# Patient Record
Sex: Female | Born: 1980 | Race: Black or African American | Hispanic: No | Marital: Single | Smoking: Current every day smoker
Health system: Southern US, Community
[De-identification: ages and names within clinical notes are randomized; demographics above are authoritative.]

## PROBLEM LIST (undated history)

## (undated) DIAGNOSIS — E669 Obesity, unspecified: Secondary | ICD-10-CM

## (undated) HISTORY — PX: ABDOMINAL HYSTERECTOMY: SHX81

---
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
## 2000-01-05 ENCOUNTER — Emergency Department (HOSPITAL_COMMUNITY): Admission: EM | Admit: 2000-01-05 | Discharge: 2000-01-05 | Payer: Self-pay | Admitting: Emergency Medicine

## 2000-01-05 ENCOUNTER — Encounter: Payer: Self-pay | Admitting: Emergency Medicine

## 2000-01-09 ENCOUNTER — Observation Stay (HOSPITAL_COMMUNITY): Admission: AD | Admit: 2000-01-09 | Discharge: 2000-01-10 | Payer: Self-pay | Admitting: Obstetrics and Gynecology

## 2000-01-31 ENCOUNTER — Inpatient Hospital Stay (HOSPITAL_COMMUNITY): Admission: AD | Admit: 2000-01-31 | Discharge: 2000-01-31 | Payer: Self-pay | Admitting: Obstetrics

## 2000-02-01 ENCOUNTER — Inpatient Hospital Stay: Admission: AD | Admit: 2000-02-01 | Discharge: 2000-02-01 | Payer: Self-pay | Admitting: Obstetrics & Gynecology

## 2000-02-05 ENCOUNTER — Inpatient Hospital Stay (HOSPITAL_COMMUNITY): Admission: AD | Admit: 2000-02-05 | Discharge: 2000-02-07 | Payer: Self-pay | Admitting: Obstetrics

## 2000-02-12 ENCOUNTER — Inpatient Hospital Stay (HOSPITAL_COMMUNITY): Admission: AD | Admit: 2000-02-12 | Discharge: 2000-02-13 | Payer: Self-pay | Admitting: Obstetrics

## 2000-02-26 ENCOUNTER — Inpatient Hospital Stay (HOSPITAL_COMMUNITY): Admission: AD | Admit: 2000-02-26 | Discharge: 2000-03-01 | Payer: Self-pay | Admitting: Obstetrics & Gynecology

## 2000-03-05 ENCOUNTER — Inpatient Hospital Stay (HOSPITAL_COMMUNITY): Admission: AD | Admit: 2000-03-05 | Discharge: 2000-03-05 | Payer: Self-pay | Admitting: *Deleted

## 2000-03-15 ENCOUNTER — Inpatient Hospital Stay (HOSPITAL_COMMUNITY): Admission: AD | Admit: 2000-03-15 | Discharge: 2000-03-15 | Payer: Self-pay | Admitting: *Deleted

## 2000-03-17 ENCOUNTER — Inpatient Hospital Stay (HOSPITAL_COMMUNITY): Admission: AD | Admit: 2000-03-17 | Discharge: 2000-03-17 | Payer: Self-pay | Admitting: Obstetrics & Gynecology

## 2000-03-19 ENCOUNTER — Inpatient Hospital Stay (HOSPITAL_COMMUNITY): Admission: AD | Admit: 2000-03-19 | Discharge: 2000-03-21 | Payer: Self-pay | Admitting: Obstetrics and Gynecology

## 2000-03-20 ENCOUNTER — Encounter: Payer: Self-pay | Admitting: Obstetrics and Gynecology

## 2000-04-04 ENCOUNTER — Other Ambulatory Visit: Admission: RE | Admit: 2000-04-04 | Discharge: 2000-04-04 | Payer: Self-pay | Admitting: *Deleted

## 2000-04-04 ENCOUNTER — Observation Stay (HOSPITAL_COMMUNITY): Admission: AD | Admit: 2000-04-04 | Discharge: 2000-04-04 | Payer: Self-pay | Admitting: *Deleted

## 2000-04-11 ENCOUNTER — Ambulatory Visit (HOSPITAL_COMMUNITY): Admission: RE | Admit: 2000-04-11 | Discharge: 2000-04-11 | Payer: Self-pay | Admitting: *Deleted

## 2000-04-11 ENCOUNTER — Encounter: Payer: Self-pay | Admitting: *Deleted

## 2000-04-28 ENCOUNTER — Observation Stay (HOSPITAL_COMMUNITY): Admission: AD | Admit: 2000-04-28 | Discharge: 2000-04-29 | Payer: Self-pay | Admitting: Obstetrics & Gynecology

## 2000-05-19 ENCOUNTER — Inpatient Hospital Stay (HOSPITAL_COMMUNITY): Admission: AD | Admit: 2000-05-19 | Discharge: 2000-05-19 | Payer: Self-pay | Admitting: Obstetrics and Gynecology

## 2000-06-03 ENCOUNTER — Encounter: Payer: Self-pay | Admitting: Obstetrics and Gynecology

## 2000-06-03 ENCOUNTER — Ambulatory Visit (HOSPITAL_COMMUNITY): Admission: RE | Admit: 2000-06-03 | Discharge: 2000-06-03 | Payer: Self-pay | Admitting: Obstetrics and Gynecology

## 2000-07-16 ENCOUNTER — Inpatient Hospital Stay (HOSPITAL_COMMUNITY): Admission: AD | Admit: 2000-07-16 | Discharge: 2000-07-16 | Payer: Self-pay | Admitting: Obstetrics and Gynecology

## 2000-07-30 ENCOUNTER — Inpatient Hospital Stay (HOSPITAL_COMMUNITY): Admission: AD | Admit: 2000-07-30 | Discharge: 2000-07-30 | Payer: Self-pay | Admitting: Obstetrics and Gynecology

## 2000-08-03 ENCOUNTER — Inpatient Hospital Stay (HOSPITAL_COMMUNITY): Admission: AD | Admit: 2000-08-03 | Discharge: 2000-08-03 | Payer: Self-pay | Admitting: *Deleted

## 2000-08-06 ENCOUNTER — Inpatient Hospital Stay (HOSPITAL_COMMUNITY): Admission: RE | Admit: 2000-08-06 | Discharge: 2000-08-06 | Payer: Self-pay | Admitting: Obstetrics and Gynecology

## 2000-08-06 ENCOUNTER — Encounter: Payer: Self-pay | Admitting: Obstetrics and Gynecology

## 2000-08-09 ENCOUNTER — Observation Stay (HOSPITAL_COMMUNITY): Admission: AD | Admit: 2000-08-09 | Discharge: 2000-08-10 | Payer: Self-pay | Admitting: Obstetrics and Gynecology

## 2000-08-17 ENCOUNTER — Inpatient Hospital Stay (HOSPITAL_COMMUNITY): Admission: AD | Admit: 2000-08-17 | Discharge: 2000-08-17 | Payer: Self-pay | Admitting: Obstetrics and Gynecology

## 2000-08-19 ENCOUNTER — Inpatient Hospital Stay (HOSPITAL_COMMUNITY): Admission: AD | Admit: 2000-08-19 | Discharge: 2000-08-22 | Payer: Self-pay | Admitting: *Deleted

## 2001-01-02 ENCOUNTER — Emergency Department (HOSPITAL_COMMUNITY): Admission: EM | Admit: 2001-01-02 | Discharge: 2001-01-02 | Payer: Self-pay | Admitting: Emergency Medicine

## 2001-01-02 ENCOUNTER — Encounter: Payer: Self-pay | Admitting: Emergency Medicine

## 2001-07-20 ENCOUNTER — Emergency Department (HOSPITAL_COMMUNITY): Admission: EM | Admit: 2001-07-20 | Discharge: 2001-07-21 | Payer: Self-pay | Admitting: Internal Medicine

## 2002-05-09 ENCOUNTER — Emergency Department (HOSPITAL_COMMUNITY): Admission: EM | Admit: 2002-05-09 | Discharge: 2002-05-09 | Payer: Self-pay | Admitting: Emergency Medicine

## 2002-05-14 ENCOUNTER — Inpatient Hospital Stay (HOSPITAL_COMMUNITY): Admission: AD | Admit: 2002-05-14 | Discharge: 2002-05-14 | Payer: Self-pay | Admitting: Obstetrics and Gynecology

## 2002-05-15 ENCOUNTER — Inpatient Hospital Stay (HOSPITAL_COMMUNITY): Admission: AD | Admit: 2002-05-15 | Discharge: 2002-05-15 | Payer: Self-pay | Admitting: Obstetrics and Gynecology

## 2002-05-17 ENCOUNTER — Inpatient Hospital Stay (HOSPITAL_COMMUNITY): Admission: AD | Admit: 2002-05-17 | Discharge: 2002-05-17 | Payer: Self-pay | Admitting: *Deleted

## 2002-05-17 ENCOUNTER — Encounter: Payer: Self-pay | Admitting: Obstetrics and Gynecology

## 2002-05-19 ENCOUNTER — Inpatient Hospital Stay (HOSPITAL_COMMUNITY): Admission: AD | Admit: 2002-05-19 | Discharge: 2002-05-19 | Payer: Self-pay | Admitting: Obstetrics and Gynecology

## 2002-05-21 ENCOUNTER — Observation Stay (HOSPITAL_COMMUNITY): Admission: AD | Admit: 2002-05-21 | Discharge: 2002-05-22 | Payer: Self-pay | Admitting: Obstetrics and Gynecology

## 2002-05-31 ENCOUNTER — Inpatient Hospital Stay (HOSPITAL_COMMUNITY): Admission: AD | Admit: 2002-05-31 | Discharge: 2002-05-31 | Payer: Self-pay | Admitting: Obstetrics and Gynecology

## 2002-06-01 ENCOUNTER — Inpatient Hospital Stay (HOSPITAL_COMMUNITY): Admission: AD | Admit: 2002-06-01 | Discharge: 2002-06-01 | Payer: Self-pay | Admitting: Obstetrics and Gynecology

## 2002-06-04 ENCOUNTER — Inpatient Hospital Stay (HOSPITAL_COMMUNITY): Admission: AD | Admit: 2002-06-04 | Discharge: 2002-06-08 | Payer: Self-pay | Admitting: Obstetrics and Gynecology

## 2002-06-11 ENCOUNTER — Encounter: Admission: RE | Admit: 2002-06-11 | Discharge: 2002-06-11 | Payer: Self-pay | Admitting: *Deleted

## 2002-06-19 ENCOUNTER — Inpatient Hospital Stay (HOSPITAL_COMMUNITY): Admission: AD | Admit: 2002-06-19 | Discharge: 2002-06-19 | Payer: Self-pay | Admitting: *Deleted

## 2002-06-23 ENCOUNTER — Inpatient Hospital Stay (HOSPITAL_COMMUNITY): Admission: AD | Admit: 2002-06-23 | Discharge: 2002-06-26 | Payer: Self-pay | Admitting: *Deleted

## 2002-07-02 ENCOUNTER — Ambulatory Visit (HOSPITAL_COMMUNITY): Admission: RE | Admit: 2002-07-02 | Discharge: 2002-07-02 | Payer: Self-pay | Admitting: *Deleted

## 2002-07-02 ENCOUNTER — Encounter: Admission: RE | Admit: 2002-07-02 | Discharge: 2002-07-02 | Payer: Self-pay | Admitting: *Deleted

## 2002-07-02 ENCOUNTER — Inpatient Hospital Stay (HOSPITAL_COMMUNITY): Admission: AD | Admit: 2002-07-02 | Discharge: 2002-07-04 | Payer: Self-pay | Admitting: *Deleted

## 2002-07-09 ENCOUNTER — Encounter: Admission: RE | Admit: 2002-07-09 | Discharge: 2002-07-09 | Payer: Self-pay | Admitting: *Deleted

## 2002-07-11 ENCOUNTER — Inpatient Hospital Stay (HOSPITAL_COMMUNITY): Admission: AD | Admit: 2002-07-11 | Discharge: 2002-07-17 | Payer: Self-pay | Admitting: *Deleted

## 2002-07-12 ENCOUNTER — Encounter: Payer: Self-pay | Admitting: *Deleted

## 2002-07-21 ENCOUNTER — Inpatient Hospital Stay (HOSPITAL_COMMUNITY): Admission: AD | Admit: 2002-07-21 | Discharge: 2002-07-21 | Payer: Self-pay | Admitting: *Deleted

## 2002-07-23 ENCOUNTER — Encounter: Admission: RE | Admit: 2002-07-23 | Discharge: 2002-07-23 | Payer: Self-pay | Admitting: *Deleted

## 2002-07-25 ENCOUNTER — Inpatient Hospital Stay (HOSPITAL_COMMUNITY): Admission: AD | Admit: 2002-07-25 | Discharge: 2002-07-30 | Payer: Self-pay | Admitting: *Deleted

## 2002-07-28 ENCOUNTER — Encounter: Payer: Self-pay | Admitting: *Deleted

## 2002-08-03 ENCOUNTER — Inpatient Hospital Stay (HOSPITAL_COMMUNITY): Admission: AD | Admit: 2002-08-03 | Discharge: 2002-08-07 | Payer: Self-pay | Admitting: Obstetrics and Gynecology

## 2002-08-13 ENCOUNTER — Observation Stay (HOSPITAL_COMMUNITY): Admission: AD | Admit: 2002-08-13 | Discharge: 2002-08-14 | Payer: Self-pay | Admitting: *Deleted

## 2002-08-13 ENCOUNTER — Encounter: Admission: RE | Admit: 2002-08-13 | Discharge: 2002-08-13 | Payer: Self-pay | Admitting: *Deleted

## 2002-08-20 ENCOUNTER — Inpatient Hospital Stay (HOSPITAL_COMMUNITY): Admission: AD | Admit: 2002-08-20 | Discharge: 2002-08-23 | Payer: Self-pay | Admitting: *Deleted

## 2002-08-27 ENCOUNTER — Encounter: Admission: RE | Admit: 2002-08-27 | Discharge: 2002-08-27 | Payer: Self-pay | Admitting: *Deleted

## 2002-09-03 ENCOUNTER — Encounter: Admission: RE | Admit: 2002-09-03 | Discharge: 2002-09-03 | Payer: Self-pay | Admitting: *Deleted

## 2002-09-14 ENCOUNTER — Ambulatory Visit (HOSPITAL_COMMUNITY): Admission: RE | Admit: 2002-09-14 | Discharge: 2002-09-14 | Payer: Self-pay | Admitting: *Deleted

## 2002-09-17 ENCOUNTER — Encounter: Admission: RE | Admit: 2002-09-17 | Discharge: 2002-09-17 | Payer: Self-pay | Admitting: *Deleted

## 2002-09-24 ENCOUNTER — Inpatient Hospital Stay (HOSPITAL_COMMUNITY): Admission: AD | Admit: 2002-09-24 | Discharge: 2002-09-26 | Payer: Self-pay | Admitting: Obstetrics and Gynecology

## 2002-10-01 ENCOUNTER — Encounter: Admission: RE | Admit: 2002-10-01 | Discharge: 2002-10-01 | Payer: Self-pay | Admitting: *Deleted

## 2002-10-03 ENCOUNTER — Inpatient Hospital Stay (HOSPITAL_COMMUNITY): Admission: AD | Admit: 2002-10-03 | Discharge: 2002-10-03 | Payer: Self-pay | Admitting: *Deleted

## 2002-10-07 ENCOUNTER — Inpatient Hospital Stay (HOSPITAL_COMMUNITY): Admission: AD | Admit: 2002-10-07 | Discharge: 2002-10-07 | Payer: Self-pay | Admitting: Obstetrics and Gynecology

## 2002-10-08 ENCOUNTER — Encounter: Admission: RE | Admit: 2002-10-08 | Discharge: 2002-10-08 | Payer: Self-pay | Admitting: *Deleted

## 2002-10-08 ENCOUNTER — Inpatient Hospital Stay (HOSPITAL_COMMUNITY): Admission: AD | Admit: 2002-10-08 | Discharge: 2002-10-14 | Payer: Self-pay | Admitting: Obstetrics and Gynecology

## 2002-10-10 ENCOUNTER — Encounter: Payer: Self-pay | Admitting: *Deleted

## 2002-10-15 ENCOUNTER — Inpatient Hospital Stay (HOSPITAL_COMMUNITY): Admission: AD | Admit: 2002-10-15 | Discharge: 2002-10-17 | Payer: Self-pay | Admitting: *Deleted

## 2002-10-15 ENCOUNTER — Encounter: Admission: RE | Admit: 2002-10-15 | Discharge: 2002-10-15 | Payer: Self-pay | Admitting: *Deleted

## 2002-10-22 ENCOUNTER — Encounter: Admission: RE | Admit: 2002-10-22 | Discharge: 2002-10-22 | Payer: Self-pay | Admitting: *Deleted

## 2002-10-22 ENCOUNTER — Inpatient Hospital Stay (HOSPITAL_COMMUNITY): Admission: AD | Admit: 2002-10-22 | Discharge: 2002-10-26 | Payer: Self-pay | Admitting: *Deleted

## 2002-10-29 ENCOUNTER — Encounter: Admission: RE | Admit: 2002-10-29 | Discharge: 2002-10-29 | Payer: Self-pay | Admitting: *Deleted

## 2002-11-04 ENCOUNTER — Ambulatory Visit (HOSPITAL_COMMUNITY): Admission: RE | Admit: 2002-11-04 | Discharge: 2002-11-04 | Payer: Self-pay | Admitting: *Deleted

## 2002-11-05 ENCOUNTER — Encounter: Admission: RE | Admit: 2002-11-05 | Discharge: 2002-11-05 | Payer: Self-pay | Admitting: *Deleted

## 2002-11-12 ENCOUNTER — Encounter: Admission: RE | Admit: 2002-11-12 | Discharge: 2002-11-12 | Payer: Self-pay | Admitting: *Deleted

## 2002-11-19 ENCOUNTER — Encounter: Admission: RE | Admit: 2002-11-19 | Discharge: 2002-11-19 | Payer: Self-pay | Admitting: Obstetrics and Gynecology

## 2002-11-22 ENCOUNTER — Observation Stay (HOSPITAL_COMMUNITY): Admission: AD | Admit: 2002-11-22 | Discharge: 2002-11-23 | Payer: Self-pay | Admitting: *Deleted

## 2002-11-24 ENCOUNTER — Ambulatory Visit (HOSPITAL_COMMUNITY): Admission: RE | Admit: 2002-11-24 | Discharge: 2002-11-24 | Payer: Self-pay | Admitting: *Deleted

## 2002-12-03 ENCOUNTER — Encounter: Admission: RE | Admit: 2002-12-03 | Discharge: 2002-12-03 | Payer: Self-pay | Admitting: *Deleted

## 2002-12-04 ENCOUNTER — Ambulatory Visit (HOSPITAL_COMMUNITY): Admission: RE | Admit: 2002-12-04 | Discharge: 2002-12-04 | Payer: Self-pay | Admitting: *Deleted

## 2002-12-10 ENCOUNTER — Encounter: Admission: RE | Admit: 2002-12-10 | Discharge: 2002-12-10 | Payer: Self-pay | Admitting: *Deleted

## 2002-12-15 ENCOUNTER — Inpatient Hospital Stay (HOSPITAL_COMMUNITY): Admission: AD | Admit: 2002-12-15 | Discharge: 2002-12-15 | Payer: Self-pay | Admitting: *Deleted

## 2002-12-15 ENCOUNTER — Encounter: Payer: Self-pay | Admitting: *Deleted

## 2002-12-17 ENCOUNTER — Encounter: Admission: RE | Admit: 2002-12-17 | Discharge: 2002-12-17 | Payer: Self-pay | Admitting: *Deleted

## 2002-12-17 ENCOUNTER — Inpatient Hospital Stay (HOSPITAL_COMMUNITY): Admission: AD | Admit: 2002-12-17 | Discharge: 2002-12-17 | Payer: Self-pay | Admitting: *Deleted

## 2002-12-18 ENCOUNTER — Encounter (HOSPITAL_COMMUNITY): Admission: RE | Admit: 2002-12-18 | Discharge: 2002-12-25 | Payer: Self-pay | Admitting: *Deleted

## 2002-12-25 ENCOUNTER — Inpatient Hospital Stay (HOSPITAL_COMMUNITY): Admission: AD | Admit: 2002-12-25 | Discharge: 2002-12-25 | Payer: Self-pay | Admitting: Obstetrics and Gynecology

## 2002-12-27 ENCOUNTER — Inpatient Hospital Stay (HOSPITAL_COMMUNITY): Admission: AD | Admit: 2002-12-27 | Discharge: 2002-12-27 | Payer: Self-pay | Admitting: *Deleted

## 2002-12-30 ENCOUNTER — Encounter: Admission: RE | Admit: 2002-12-30 | Discharge: 2002-12-30 | Payer: Self-pay | Admitting: *Deleted

## 2002-12-30 ENCOUNTER — Inpatient Hospital Stay (HOSPITAL_COMMUNITY): Admission: AD | Admit: 2002-12-30 | Discharge: 2003-01-02 | Payer: Self-pay | Admitting: Obstetrics and Gynecology

## 2003-05-05 ENCOUNTER — Encounter: Payer: Self-pay | Admitting: Emergency Medicine

## 2003-05-05 ENCOUNTER — Emergency Department (HOSPITAL_COMMUNITY): Admission: EM | Admit: 2003-05-05 | Discharge: 2003-05-05 | Payer: Self-pay | Admitting: Emergency Medicine

## 2003-05-09 ENCOUNTER — Inpatient Hospital Stay (HOSPITAL_COMMUNITY): Admission: AD | Admit: 2003-05-09 | Discharge: 2003-05-14 | Payer: Self-pay | Admitting: Obstetrics and Gynecology

## 2003-07-06 ENCOUNTER — Other Ambulatory Visit: Admission: RE | Admit: 2003-07-06 | Discharge: 2003-07-06 | Payer: Self-pay | Admitting: Obstetrics and Gynecology

## 2003-07-06 ENCOUNTER — Other Ambulatory Visit: Admission: RE | Admit: 2003-07-06 | Discharge: 2003-07-06 | Payer: Self-pay | Admitting: *Deleted

## 2003-09-30 ENCOUNTER — Ambulatory Visit (HOSPITAL_COMMUNITY): Admission: RE | Admit: 2003-09-30 | Discharge: 2003-09-30 | Payer: Self-pay | Admitting: Obstetrics and Gynecology

## 2003-09-30 ENCOUNTER — Encounter: Payer: Self-pay | Admitting: Obstetrics and Gynecology

## 2003-10-19 ENCOUNTER — Encounter: Admission: RE | Admit: 2003-10-19 | Discharge: 2003-10-19 | Payer: Self-pay | Admitting: Obstetrics and Gynecology

## 2003-10-22 ENCOUNTER — Encounter: Admission: RE | Admit: 2003-10-22 | Discharge: 2003-10-22 | Payer: Self-pay | Admitting: Obstetrics and Gynecology

## 2003-10-26 ENCOUNTER — Ambulatory Visit (HOSPITAL_COMMUNITY): Admission: RE | Admit: 2003-10-26 | Discharge: 2003-10-26 | Payer: Self-pay | Admitting: Obstetrics and Gynecology

## 2003-10-26 ENCOUNTER — Encounter: Admission: RE | Admit: 2003-10-26 | Discharge: 2003-10-26 | Payer: Self-pay | Admitting: *Deleted

## 2003-10-31 ENCOUNTER — Inpatient Hospital Stay (HOSPITAL_COMMUNITY): Admission: AD | Admit: 2003-10-31 | Discharge: 2003-10-31 | Payer: Self-pay | Admitting: Obstetrics and Gynecology

## 2003-11-04 ENCOUNTER — Encounter: Admission: RE | Admit: 2003-11-04 | Discharge: 2003-11-04 | Payer: Self-pay | Admitting: Obstetrics and Gynecology

## 2003-11-07 ENCOUNTER — Inpatient Hospital Stay (HOSPITAL_COMMUNITY): Admission: AD | Admit: 2003-11-07 | Discharge: 2003-11-09 | Payer: Self-pay | Admitting: Obstetrics and Gynecology

## 2003-11-11 ENCOUNTER — Encounter: Admission: RE | Admit: 2003-11-11 | Discharge: 2003-11-11 | Payer: Self-pay | Admitting: Obstetrics and Gynecology

## 2003-11-18 ENCOUNTER — Encounter: Admission: RE | Admit: 2003-11-18 | Discharge: 2003-11-18 | Payer: Self-pay | Admitting: *Deleted

## 2003-11-18 ENCOUNTER — Inpatient Hospital Stay (HOSPITAL_COMMUNITY): Admission: AD | Admit: 2003-11-18 | Discharge: 2003-11-18 | Payer: Self-pay | Admitting: Obstetrics and Gynecology

## 2003-11-20 ENCOUNTER — Inpatient Hospital Stay (HOSPITAL_COMMUNITY): Admission: AD | Admit: 2003-11-20 | Discharge: 2003-11-20 | Payer: Self-pay | Admitting: Obstetrics and Gynecology

## 2003-11-22 ENCOUNTER — Encounter: Admission: RE | Admit: 2003-11-22 | Discharge: 2003-11-22 | Payer: Self-pay | Admitting: Obstetrics

## 2003-11-24 ENCOUNTER — Inpatient Hospital Stay (HOSPITAL_COMMUNITY): Admission: AD | Admit: 2003-11-24 | Discharge: 2003-11-24 | Payer: Self-pay | Admitting: Obstetrics and Gynecology

## 2003-11-24 ENCOUNTER — Ambulatory Visit (HOSPITAL_COMMUNITY): Admission: RE | Admit: 2003-11-24 | Discharge: 2003-11-24 | Payer: Self-pay | Admitting: Obstetrics and Gynecology

## 2003-11-24 ENCOUNTER — Encounter: Admission: RE | Admit: 2003-11-24 | Discharge: 2003-11-24 | Payer: Self-pay | Admitting: *Deleted

## 2003-11-27 ENCOUNTER — Inpatient Hospital Stay (HOSPITAL_COMMUNITY): Admission: AD | Admit: 2003-11-27 | Discharge: 2003-11-27 | Payer: Self-pay | Admitting: Obstetrics and Gynecology

## 2003-11-29 ENCOUNTER — Encounter: Admission: RE | Admit: 2003-11-29 | Discharge: 2003-11-29 | Payer: Self-pay | Admitting: Obstetrics and Gynecology

## 2003-12-02 ENCOUNTER — Encounter: Admission: RE | Admit: 2003-12-02 | Discharge: 2003-12-02 | Payer: Self-pay | Admitting: Obstetrics and Gynecology

## 2003-12-04 ENCOUNTER — Inpatient Hospital Stay (HOSPITAL_COMMUNITY): Admission: AD | Admit: 2003-12-04 | Discharge: 2003-12-05 | Payer: Self-pay | Admitting: Obstetrics and Gynecology

## 2003-12-06 ENCOUNTER — Encounter: Admission: RE | Admit: 2003-12-06 | Discharge: 2003-12-06 | Payer: Self-pay | Admitting: Obstetrics and Gynecology

## 2003-12-07 ENCOUNTER — Inpatient Hospital Stay (HOSPITAL_COMMUNITY): Admission: AD | Admit: 2003-12-07 | Discharge: 2003-12-10 | Payer: Self-pay | Admitting: Obstetrics and Gynecology

## 2003-12-08 ENCOUNTER — Encounter (INDEPENDENT_AMBULATORY_CARE_PROVIDER_SITE_OTHER): Payer: Self-pay | Admitting: Specialist

## 2004-05-02 ENCOUNTER — Ambulatory Visit (HOSPITAL_COMMUNITY): Admission: RE | Admit: 2004-05-02 | Discharge: 2004-05-02 | Payer: Self-pay | Admitting: Obstetrics and Gynecology

## 2004-05-21 ENCOUNTER — Emergency Department (HOSPITAL_COMMUNITY): Admission: EM | Admit: 2004-05-21 | Discharge: 2004-05-21 | Payer: Self-pay | Admitting: Emergency Medicine

## 2004-06-15 ENCOUNTER — Encounter: Admission: RE | Admit: 2004-06-15 | Discharge: 2004-07-21 | Payer: Self-pay | Admitting: Orthopedic Surgery

## 2004-07-11 ENCOUNTER — Encounter: Admission: RE | Admit: 2004-07-11 | Discharge: 2004-07-11 | Payer: Self-pay | Admitting: Orthopedic Surgery

## 2004-10-24 ENCOUNTER — Emergency Department (HOSPITAL_COMMUNITY): Admission: EM | Admit: 2004-10-24 | Discharge: 2004-10-24 | Payer: Self-pay | Admitting: Emergency Medicine

## 2004-12-18 ENCOUNTER — Other Ambulatory Visit: Admission: RE | Admit: 2004-12-18 | Discharge: 2004-12-18 | Payer: Self-pay | Admitting: Obstetrics and Gynecology

## 2005-01-11 ENCOUNTER — Ambulatory Visit (HOSPITAL_COMMUNITY): Admission: RE | Admit: 2005-01-11 | Discharge: 2005-01-11 | Payer: Self-pay | Admitting: Obstetrics and Gynecology

## 2005-01-11 ENCOUNTER — Encounter (INDEPENDENT_AMBULATORY_CARE_PROVIDER_SITE_OTHER): Payer: Self-pay | Admitting: *Deleted

## 2005-07-24 ENCOUNTER — Encounter (INDEPENDENT_AMBULATORY_CARE_PROVIDER_SITE_OTHER): Payer: Self-pay | Admitting: *Deleted

## 2005-07-24 ENCOUNTER — Inpatient Hospital Stay (HOSPITAL_COMMUNITY): Admission: RE | Admit: 2005-07-24 | Discharge: 2005-07-25 | Payer: Self-pay | Admitting: Obstetrics and Gynecology

## 2005-08-02 ENCOUNTER — Emergency Department (HOSPITAL_COMMUNITY): Admission: EM | Admit: 2005-08-02 | Discharge: 2005-08-02 | Payer: Self-pay | Admitting: Emergency Medicine

## 2005-12-12 ENCOUNTER — Other Ambulatory Visit: Admission: RE | Admit: 2005-12-12 | Discharge: 2005-12-12 | Payer: Self-pay | Admitting: Obstetrics and Gynecology

## 2005-12-13 ENCOUNTER — Ambulatory Visit (HOSPITAL_COMMUNITY): Admission: RE | Admit: 2005-12-13 | Discharge: 2005-12-13 | Payer: Self-pay | Admitting: Obstetrics and Gynecology

## 2006-02-12 ENCOUNTER — Ambulatory Visit (HOSPITAL_BASED_OUTPATIENT_CLINIC_OR_DEPARTMENT_OTHER): Admission: RE | Admit: 2006-02-12 | Discharge: 2006-02-12 | Payer: Self-pay | Admitting: Cardiology

## 2006-02-17 ENCOUNTER — Ambulatory Visit: Payer: Self-pay | Admitting: Internal Medicine

## 2006-04-03 ENCOUNTER — Encounter: Admission: RE | Admit: 2006-04-03 | Discharge: 2006-04-03 | Payer: Self-pay | Admitting: Cardiology

## 2006-04-18 ENCOUNTER — Emergency Department (HOSPITAL_COMMUNITY): Admission: EM | Admit: 2006-04-18 | Discharge: 2006-04-18 | Payer: Self-pay | Admitting: Emergency Medicine

## 2006-07-20 ENCOUNTER — Emergency Department (HOSPITAL_COMMUNITY): Admission: EM | Admit: 2006-07-20 | Discharge: 2006-07-21 | Payer: Self-pay | Admitting: Emergency Medicine

## 2007-01-01 ENCOUNTER — Emergency Department (HOSPITAL_COMMUNITY): Admission: EM | Admit: 2007-01-01 | Discharge: 2007-01-01 | Payer: Self-pay | Admitting: Emergency Medicine

## 2008-01-20 ENCOUNTER — Emergency Department (HOSPITAL_COMMUNITY): Admission: EM | Admit: 2008-01-20 | Discharge: 2008-01-20 | Payer: Self-pay | Admitting: Emergency Medicine

## 2008-02-04 ENCOUNTER — Ambulatory Visit (HOSPITAL_COMMUNITY): Admission: RE | Admit: 2008-02-04 | Discharge: 2008-02-04 | Payer: Self-pay | Admitting: Obstetrics & Gynecology

## 2009-06-14 ENCOUNTER — Emergency Department (HOSPITAL_COMMUNITY): Admission: EM | Admit: 2009-06-14 | Discharge: 2009-06-14 | Payer: Self-pay | Admitting: Emergency Medicine

## 2009-06-20 ENCOUNTER — Emergency Department (HOSPITAL_COMMUNITY): Admission: EM | Admit: 2009-06-20 | Discharge: 2009-06-20 | Payer: Self-pay | Admitting: Emergency Medicine

## 2010-03-10 ENCOUNTER — Emergency Department (HOSPITAL_COMMUNITY): Admission: EM | Admit: 2010-03-10 | Discharge: 2010-03-10 | Payer: Self-pay | Admitting: Emergency Medicine

## 2010-04-14 ENCOUNTER — Emergency Department (HOSPITAL_COMMUNITY): Admission: EM | Admit: 2010-04-14 | Discharge: 2010-04-14 | Payer: Self-pay | Admitting: Family Medicine

## 2010-07-07 IMAGING — CR DG ANKLE COMPLETE 3+V*L*
3 series · 3 of 3 positions shown · non-contrast
Comparison: None.

CLINICAL DATA: Left ankle pain, no trauma

LEFT ANKLE COMPLETE - 3+ VIEW

[view not recorded (1 of 3)]
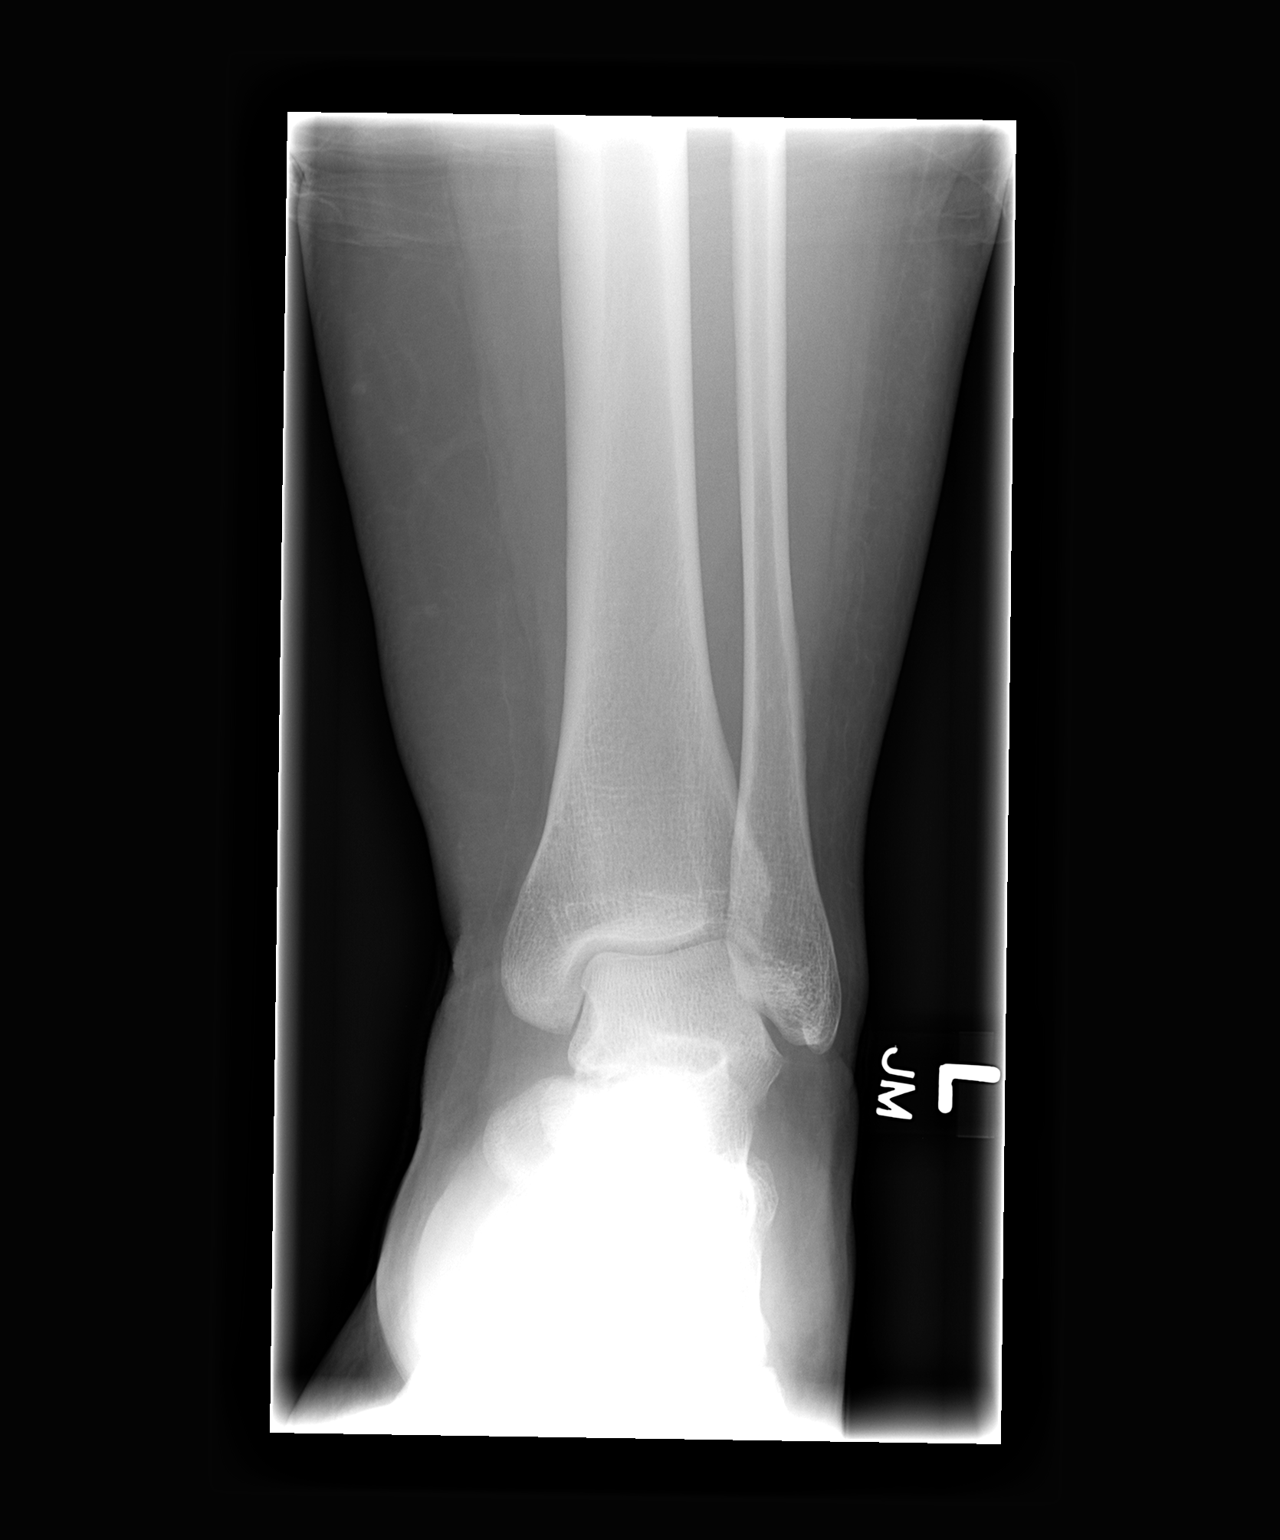

[view not recorded (2 of 3)]
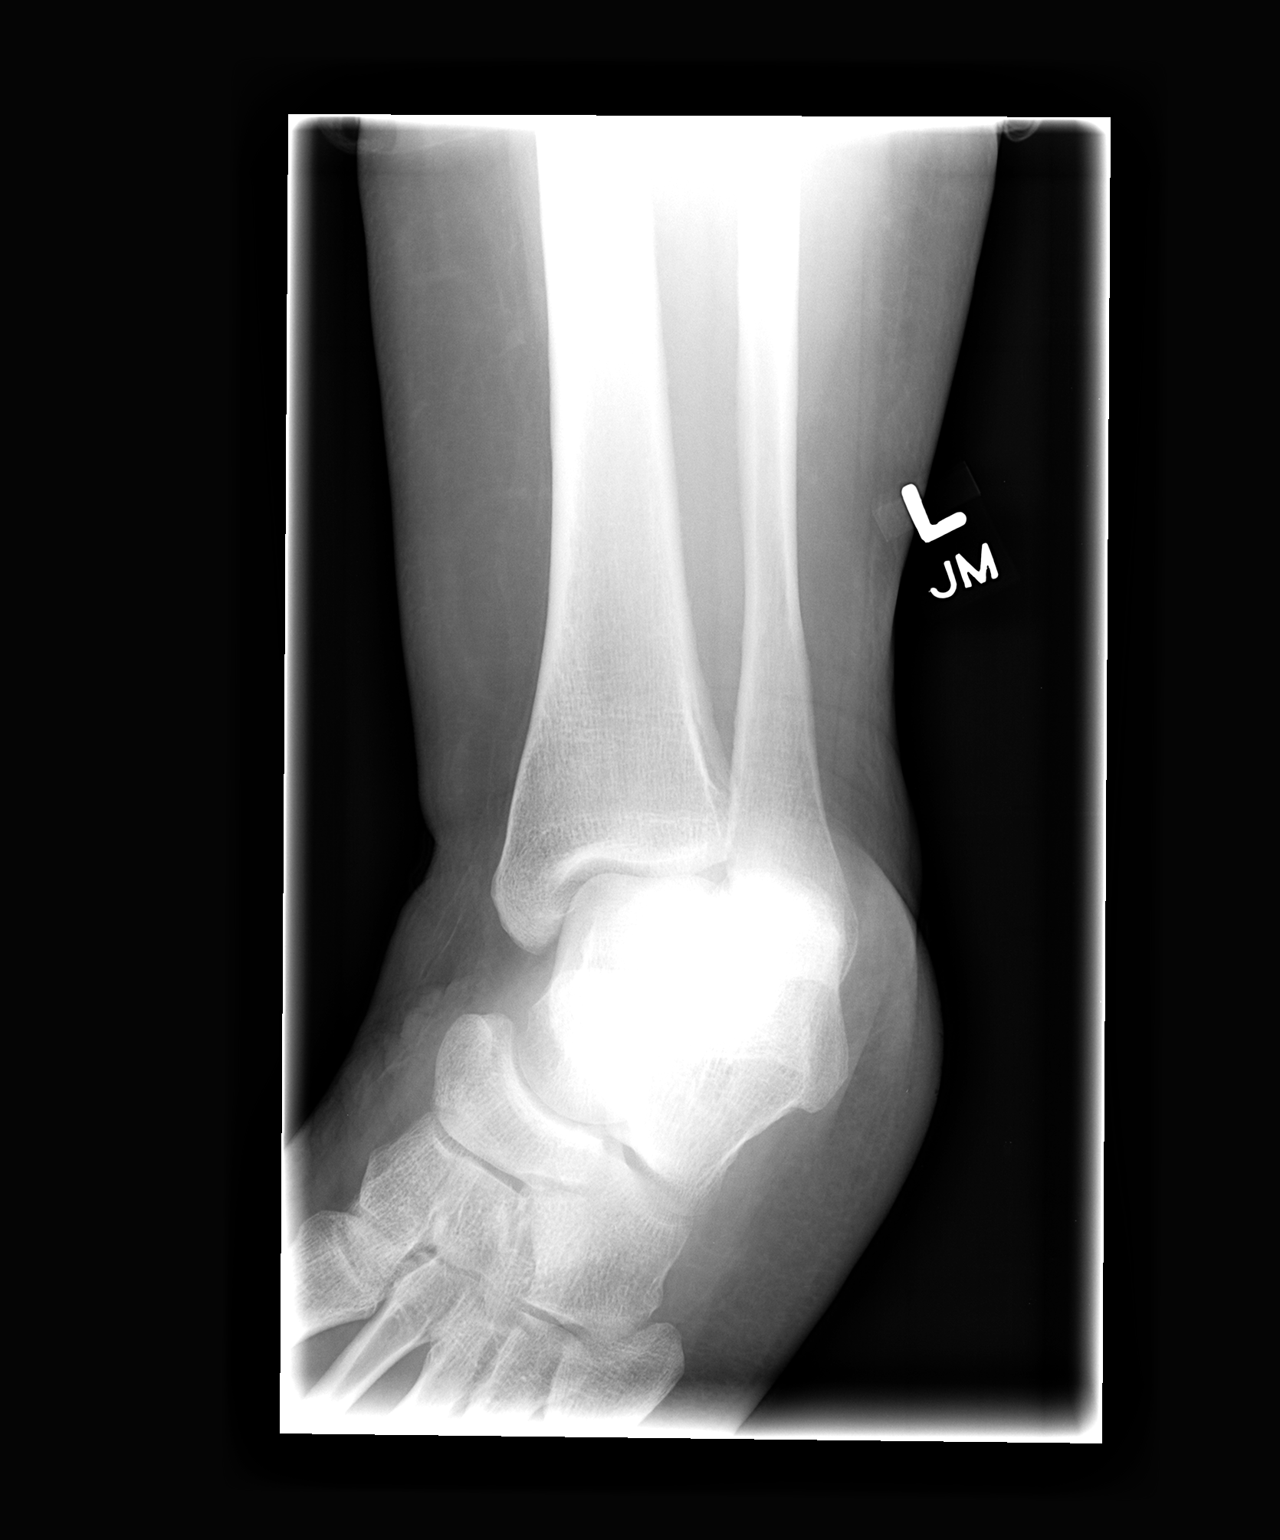

[view not recorded (3 of 3)]
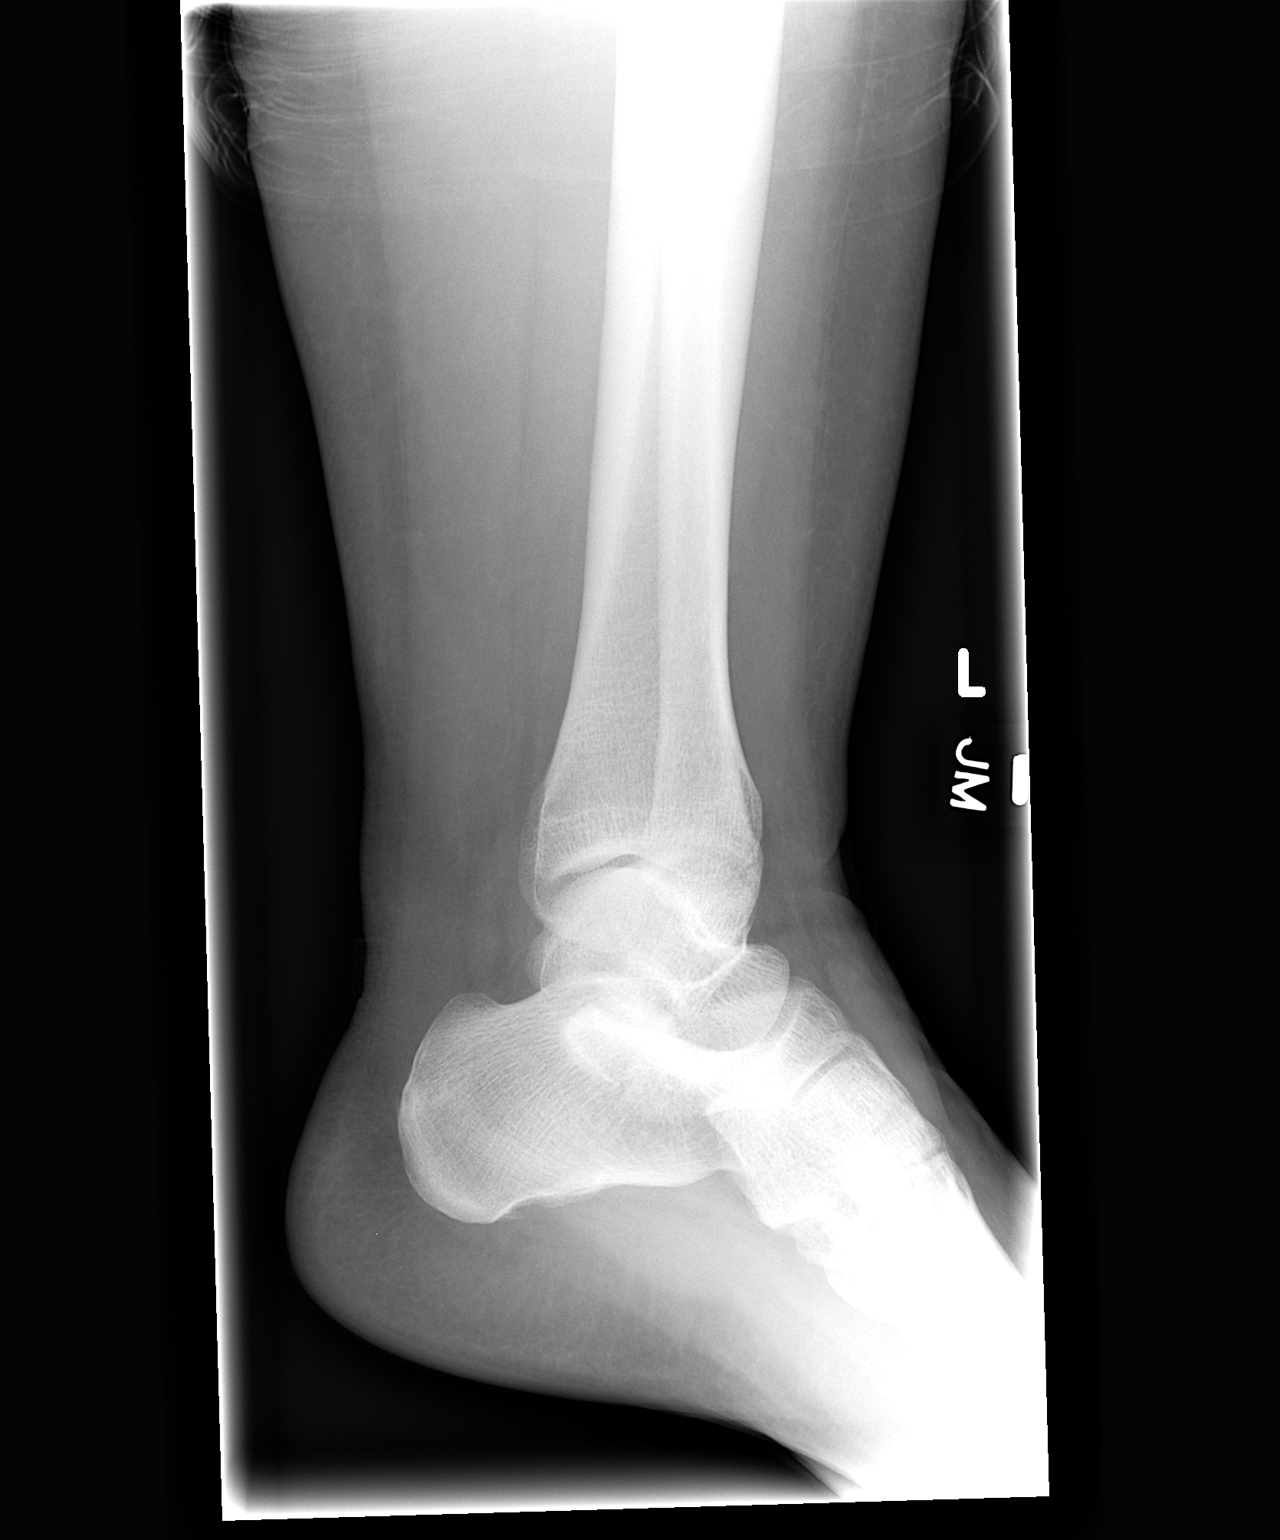

[3 of 3 positions shown; findings below may reference images not displayed]

FINDINGS: The mortise view is suboptimally positioned. No fracture
or dislocation.  No soft tissue abnormality.  No radiopaque foreign
body.
IMPRESSION: Normal exam.

## 2011-01-21 ENCOUNTER — Encounter: Payer: Self-pay | Admitting: Obstetrics & Gynecology

## 2011-03-25 LAB — DIFFERENTIAL
Basophils Absolute: 0 10*3/uL (ref 0.0–0.1)
Monocytes Relative: 11 % (ref 3–12)
Neutrophils Relative %: 53 % (ref 43–77)

## 2011-03-25 LAB — URINALYSIS, ROUTINE W REFLEX MICROSCOPIC
Glucose, UA: NEGATIVE mg/dL
Nitrite: NEGATIVE
Protein, ur: 30 mg/dL — AB
pH: 7 (ref 5.0–8.0)

## 2011-03-25 LAB — URINE MICROSCOPIC-ADD ON

## 2011-03-25 LAB — COMPREHENSIVE METABOLIC PANEL
AST: 21 U/L (ref 0–37)
Albumin: 3.9 g/dL (ref 3.5–5.2)
Alkaline Phosphatase: 59 U/L (ref 39–117)
BUN: 5 mg/dL — ABNORMAL LOW (ref 6–23)
Calcium: 9.2 mg/dL (ref 8.4–10.5)
Creatinine, Ser: 0.9 mg/dL (ref 0.4–1.2)
Glucose, Bld: 101 mg/dL — ABNORMAL HIGH (ref 70–99)
Potassium: 3.1 mEq/L — ABNORMAL LOW (ref 3.5–5.1)
Sodium: 139 mEq/L (ref 135–145)

## 2011-03-25 LAB — CBC
HCT: 47.7 % — ABNORMAL HIGH (ref 36.0–46.0)
MCHC: 34.2 g/dL (ref 30.0–36.0)
Platelets: 286 10*3/uL (ref 150–400)
RDW: 14.3 % (ref 11.5–15.5)

## 2011-03-25 LAB — LIPASE, BLOOD: Lipase: 15 U/L (ref 11–59)

## 2011-03-25 LAB — POCT PREGNANCY, URINE: Preg Test, Ur: NEGATIVE

## 2011-05-18 NOTE — Discharge Summary (Signed)
[facility]  Patient:    [name], [name]                    MRN: [id number] Adm. Date:  [id number] Disc. Date: [id number] Attending:  Leonard Schwartz Dictator:   Miguel Dibble, C.N.M.                           Discharge Summary  DATE OF BIRTH:                [birth year]  ADMISSION DIAGNOSES:          1. Intrauterine pregnancy at 36-6/7 weeks.                               2. Rule out early labor.                               3. Proteinuria without evidence of preeclampsia.                               4. Persistent hyperemesis.                               5. Obesity.                               6. Asthma.  DISCHARGE DIAGNOSES:          1. Intrauterine pregnancy at 36-6/7 weeks.                               2. Rule out early labor.                               3. Proteinuria without evidence of preeclampsia.                               4. Persistent hyperemesis.                               5. Obesity.                               6. Asthma.                               7. Not in labor.  PROCEDURES:                   Electronic fetal monitoring.  HISTORY OF PRESENT ILLNESS:   [age]-year-old gravida 1, para 0, [name] was evaluated in maternity admissions unit on August 09, 2000, for approximately one hour.  After ambulation, her cervix changed from 3 cm, 70-80% effaced, vertex at -2, with slightly bulging bag of waters to 4 cm, 80%, vertex at -2 to -3, with bloody show.  HOSPITAL COURSE:  She was admitted for labor management.  However, by 7:10 a.m., her cervix had retracted to 2-3 cm, 75% effaced, and the presenting part was ballottable.  Her platelets were 192,000, and laboratories were satisfactory.  Her urinalysis continued to indicate proteinuria of 100 mg/dl and greater than 80 ketones.  Her hemoglobin was 10.2, hematocrit 33.3, white count 6.9, platelets 392.  Potassium 3.2, BUN 5,  alkaline phosphatase 125, SGOT 16, SGPT 17, uric acid 5.5.  She appeared not to be in labor.  Fetal heart rate was reactive.  She had rare mild contractions.  She was discharged home in stable condition.  DISCHARGE INSTRUCTIONS:       Labor precautions.  FOLLOW-UP:                    In the office next week, or p.r.n. for symptoms of labor or change in kick counts.  DISCHARGE MEDICATIONS:        Prenatal vitamins and Phenergan p.r.n. DD:  08/10/00 TD:  08/12/00 Job: [id number]

## 2011-05-18 NOTE — Op Note (Signed)
[name], [name]                       ACCOUNT NO.:  [id number]   MEDICAL RECORD NO.:  [id number]                   PATIENT TYPE:  INP   LOCATION:  9148                                 FACILITY:  [facility]   PHYSICIAN:  Naima A. Dillard, M.D.              DATE OF BIRTH:  06-14-1980   DATE OF PROCEDURE:  12/08/2003  DATE OF DISCHARGE:                                 OPERATIVE REPORT   PREOPERATIVE DIAGNOSIS:  Multiparity, desires tubal ligation.   POSTOPERATIVE DIAGNOSIS:  Multiparity, desire tubal ligation.   ANESTHESIA:  Epidural.   SURGEON:  Naima A. Dillard, M.D.   ESTIMATED BLOOD LOSS:  Minimal.   FLUIDS REPLACED:  2000 mL crystalloid.   COMPLICATIONS:  None.   The patient went to the recovery room in stable condition.  Before the  procedure the patient was told that the risks are but not limited to risk of  anesthesia, bleeding, infection, damage to internal organs such as bowel,  bladder, and major blood vessels, also a failure rate of about one in 200 to  one in 300, and half of those could result in pregnancy, ectopic pregnancy,  which is a life-threatening condition.  We re-reviewed all forms of birth  control, including vasectomy, in the office and right before the tubal  ligation.  The patient still consented for tubal ligation.   She was taken to the operating room, where her epidural anesthesia was  redosed and found to be adequate.  A small infraumbilical incision was made  after 10 mL of 0.5% Marcaine with epinephrine was injected subcutaneously.  The knife was then carried through the skin and then carried down to the  fascia.  The fascia was then incised and extended bilaterally with Mayo  scissors.  The peritoneum was identified, tented up, and entered sharply  with Metzenbaum scissors.  Two S retractors were placed into the abdomen.  The patient's right fallopian tube was grasped with Babcock clamps, followed  out to the fimbriated end.  About a  centimeter of the mid-isthmic portion of  the tube was ligated and excised, and it was ligated with 2-0 plain.  Hemostasis was assured.  The patient's left fallopian tube was identified,  followed out to the fimbriated end, grasped with Babcock clamp.  About a  centimeter of the mid-isthmic portion of the tube was ligated with 2-0 plain  and excised.  Hemostasis was assured.  Tubes were returned back to the  abdomen.  The fascia was closed with 0 Vicryl.  The skin was closed with 3-0  Vicryl in subcuticular fashion.  Sponge, lap, and needle counts were correct  x2.  The patient went to the recovery room in stable condition.  Naima A. Normand Sloop, M.D.    NAD/MEDQ  D:  12/08/2003  T:  12/09/2003  Job:  [id number]

## 2011-05-18 NOTE — Discharge Summary (Signed)
[name], [name]                       ACCOUNT NO.:  [id number]   MEDICAL RECORD NO.:  [id number]                   PATIENT TYPE:  INP   LOCATION:  9124                                 [facility]ACILITY:  [facility]   PHYSICIAN:  [name], M.D.                 DATE OF BIRTH:  01/27/1980   DATE OF ADMISSION:  10/15/2002  DATE OF DISCHARGE:  10/17/2002                                 DISCHARGE SUMMARY   ADMISSION DIAGNOSES:  1. Persistent nausea and vomiting with pregnancy 28 and 6/[redacted] weeks gestation.  2. Depression.   DISCHARGE DIAGNOSES:  1. Persistent nausea and vomiting/hyperemesis of pregnancy.  2. Depression.   HISTORY:  This is a 31 year old G2, P0-1-0-1 who presented at 34 and 6/7  dated by an LMP consistent with an 8 week ultrasound.  She reported keeping  no food or fluid down for approximately 24 hours.  She had just been  discharged from the hospital for the same problem one day prior to this  admission.  She states that she had been vomiting about every 10 minutes all  day long.  She did go to her [facility] appointment on the day of  admission and was found to have greater than 80 ketones, 100 protein.  Specific gravity was 1.025 on the urine dipstick.  She reported good fetal  activity, had no rupture of membranes, no contractions, no vaginal bleeding,  and no other pregnancy problems.  She denied any major life changes or  situational depression stimuli.  Her medications included Phenergan,  Protonix at time of admission.  She denied symptoms of UTI or preeclampsia.   PRENATAL LABORATORIES:  A+.  Antibody screen negative.  Hemoglobin 12.4.  Rubella immune.  Hepatitis B surface antigen negative.  Syphilis negative.  GC and Chlamydia were negative.  One hour GTT had not been done.   ALLERGIES:  Negative.   PAST MEDICAL HISTORY:  Significant for asthma, depression, anxiety, and  obesity.   PAST SURGICAL HISTORY:  None.   OB HISTORY:  She did have preeclampsia  with her first delivery and had a 36  week spontaneous vaginal delivery without other complications.   FAMILY HISTORY:  Noncontributory.   SOCIAL HISTORY:  She denied tobacco, alcohol, or drug use.   PHYSICAL EXAMINATION:  VITAL SIGNS:  Afebrile.  Pulse 94, blood pressure  114/60, weight 322 pounds.  GENERAL:  She was not in distress.  HEART:  Regular rate and rhythm.  LUNGS:  Clear to auscultation.  ABDOMEN:  Obese and nontender, consistent with 28 weeks size.  EXTREMITIES:  Without edema.  NEUROLOGIC:  Normal.  SKIN:  Good skin turgor.  PELVIC:  Fetal heart rate was 140 and without decelerations.  She had few  mild irregular contractions.   The plan was made to admit her for Zofran for control of her nausea and  encourage her to have p.o. intake and give  her IV rehydration and she was  also to get a GI and psychiatric consult.   HOSPITAL COURSE:  On hospital day one she had a follow-up GI consultation,  Dr. _______.  His assessment was that she had normal physical examination  and hyperemesis gravidarum.  He did not feel that she needed a GI work-up of  any kind.  He did recommend trying Reglan 20 mg IV and continue with  Protonix and meclizine as well as pyridoxine.  On hospital day two she  received a psychiatry consult.  This was also a follow-up consult.  She was  seen by psychiatry in the first trimester when she was thought to have some  psychotic symptoms.  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  He counseled her and talked to her about having a  case Production designer, theatre/television/film with mental health.  He decided to start her on Prozac for her  depression and to continue with ego supportive treatment.  On day of  discharge the patient was retaining some fluids and had been adequately  rehydrated.  She was discharged home on the following medications.   DISCHARGE MEDICATIONS:  1. Phenergan 25 q.6h. as needed.  2. Protonix 40 mg one q.d.  3. Pyridoxine  25 mg one p.o. t.i.d.  4. Meclizine 50 mg one q.d.  5. Vitamin one q.d.  6. Iron one q.d.  Of note, she was to start taking that after her nausea was     gone.   Fetal heart rate was reassuring for gestational age and the patient's one  hour Glucola result during this hospitalization came back at 44 which is  well within normal limits.  She was to follow up at the high risk clinic on  October 22, 2002 and was again given general measures to take care of nausea  and vomiting.     [name], C.N.M.                       [name], M.D.    DP/MEDQ  D:  11/17/2002  T:  11/17/2002  Job:  [id number]

## 2011-05-18 NOTE — Discharge Summary (Signed)
   [name], [name]                       ACCOUNT NO.:  [id number]   MEDICAL RECORD NO.:  [id number]                   PATIENT TYPE:  INP   LOCATION:  9157                                 [facility]ILITY[facility]   PHYSICIAN:  Nani Gasser, M.D.            DATE OF BIRTH:  [birth year]   DATE OF ADMISSION:  10/22/2002  DATE OF DISCHARGE:  10/26/2002                                 DISCHARGE SUMMARY   DISCHARGE INSTRUCTIONS:  The patient was discharged home with instructions  to call her doctor if she has any signs of preterm labor.   DISCHARGE MEDICATIONS:  She also has prescriptions for Phenergan and Zofran  at home for which she already has instructions on how to use.  She is also  to take ibuprofen p.r.n. for low back musculoskeletal discomfort.   DIET:  She is to eat a bland diet at home and to try to take as much p.o.  fluids as possible to prevent dehydration with her hyperemesis.   DISPOSITION:  The patient was discharged home in very stable condition.  She  is also to keep her next followup prenatal appointment.   HOSPITAL COURSE:  The patient is a 31 year old African-American female who  came in on October 23rd who is also G 2, P 0-1-0-1 with a history of  multiple admissions for hyperemesis.  On admission, she was 30-0/7 weeks  dated by her LMP.  She was admitted for worsening nausea and vomiting,  decreased p.o. intake, and a drop in weight over one weeks' period.  The  weight drop was reportedly, per the patient, a drop by 27 pounds.  The  patient was admitted and was noted to have ketones in her urine and to be  slightly hypokalemic.  She was given multiple boluses for IV rehydration.  A  cath UA was done which was negative and she was placed on IV Protonix and  Phenergan and continued with her home medications including Prozac.  The  patient did well during admission.  Her vomiting decreased and at discharge  the patient had not vomited for two days.  The patient has  also refused to  take the PR Phenergan while here and was able to keep most meals down.   CONDITION ON DISCHARGE:  The patient was stable at discharge.                                               Nani Gasser, M.D.    CM/MEDQ  D:  10/26/2002  T:  10/27/2002  Job:  [id number]

## 2011-05-18 NOTE — Discharge Summary (Signed)
[name], [name]                       ACCOUNT NO.:  [id number]   MEDICAL RECORD NO.:  [id number]                   PATIENT TYPE:  OBV   LOCATION:  9304                                 [facility]ILITY[facility]   PHYSICIAN:  Reva Bores, MD                   DATE OF BIRTH:  1980-04-10   DATE OF ADMISSION:  08/13/2002  DATE OF DISCHARGE:  08/14/2002                                 DISCHARGE SUMMARY   DISCHARGE DIAGNOSES:  1. Mild dehydration.  2. Intrauterine pregnancy at 27 and one-seventh weeks gestational age.  3. Hyperemesis.   HOSPITAL COURSE:  For full H&P please see dictation.  This is a 31 year old  G2 P0-1-0-1 who has hyperemesis gravidarum.  She continues to be unable to  tolerate p.o. intake and has had repeated admissions to the hospital for  dehydration.  The patient presented with similar complaints on August 13, 2002.  Admission laboratory data showed hemoconcentration as well as a small  amount of serum ketones.  The patient was rehydrated with approximately 3+  liters of IV fluids and her potassium was replaced orally.  The patient's  urine output has improved and she is currently stable for discharge.  The  patient has been tolerating moderate amounts of p.o. intake although she is  still experiencing significant nausea.  The patient will be discharged home  with close outpatient follow up.  Discharge weight is 335 pounds.  Previous  discharge weight was 331 pounds.  The patient has also been noted to be  anemic, likely secondary to iron deficiency.  She has been unable to  tolerate her iron tablets, as well as experiencing constipation from these.  The patient has been given a prescription for Colace to take to prevent  constipation, and has been told to continue taking her iron supplementation.  The patient also states she is intolerant of prenatal vitamins, thus  Flintstone Vitamins two tablets p.o. q.d. have been recommended.   LABORATORY DATA:  CBC on  admission:  Hemoglobin 10.3, hematocrit 31%,  platelet count 439, white blood cell count 11.4.  Discharge CBC:  Hemoglobin  8.5, hematocrit 26.1, platelet count 395, white blood cell count 8.6.  Complete metabolic profile on admission was significant for decreased  potassium 3.2, creatinine 0.7, and serum albumin 3.4.  LFTs were entirely  within normal limits.  Discharge basic metabolic profile showed resolution  of hypokalemia with potassium 3.8.  Discharge creatinine 0.6.  Labs pending  at time of discharge are urine culture which will be followed up by myself.   DISCHARGE INSTRUCTIONS:  The patient will follow up at high risk clinic in  two weeks as previously scheduled.  She has been advised to attempt to  maintain adequate p.o. intake and if she does experience significant nausea  and vomiting in the future, she should return to the hospital for IV  rehydration in the maternity admission  unit.  Plans have been discussed with  the patient and she is agreeable to discharge home.      Obstetrics Resident                       Reva Bores, MD    OR/MEDQ  D:  08/14/2002  T:  08/15/2002  Job:  [id number]   cc:   [facility]

## 2011-05-18 NOTE — Op Note (Signed)
[name], KATO             ACCOUNT NO.:  [id number]   MEDICAL RECORD NO.:  [id number]          PATIENT TYPE:  INP   LOCATION:  9399                          [facility]ACILITY:  [facility]   PHYSICIAN:  Hal Morales, M.D.DATE OF BIRTH:  1980/10/27   DATE OF PROCEDURE:  07/24/2005  DATE OF DISCHARGE:                                 OPERATIVE REPORT   PREOPERATIVE DIAGNOSIS:  Menorrhagia and dysmenorrhea, status post  endometrial ablation, desire for hysterectomy.   POSTOPERATIVE DIAGNOSES:  Menorrhagia and dysmenorrhea, status post  endometrial ablation, desire for hysterectomy.   OPERATION:  Total vaginal hysterectomy.   SURGEON:  Hal Morales, M.D.   FIRST ASSISTANT:  Elmira J. Adline Peals.   ANESTHESIA:  Epidural.   ESTIMATED BLOOD LOSS:  150 mL.   COMPLICATIONS:  None.   FINDINGS:  The uterus was upper limits of normal size weighing 105 grams.  The ovaries appeared normal bilaterally. The tubes were status post  interruption for tubal sterilization.   PROCEDURE:  The patient was taken to the operating room after appropriate  identification and placed on the operating table. After placement of an  epidural anesthetic she was placed in the lithotomy position. The perineum  and vagina were prepped with multiple layers of Betadine and a Foley  catheter inserted into the bladder and connected to straight drainage. The  perineum was draped as a sterile field. A weighted speculum was placed in  the posterior vagina and Lahey tenaculum placed on the anterior and  posterior surfaces of the cervix. The cervical mucosa was then infiltrated  with a dilute solution of Pitressin. The cervix was circumscribed and the  anterior vaginal mucosa dissected bluntly off the anterior cervix. The  anterior cul-de-sac was then entered and the bladder blade placed. The  posterior cul-de-sac was entered and the posterior vaginal cuff marked with  a suture of 0 Vicryl. The uterosacral ligaments  on the right and left side  were clamped, cut and suture ligated and those sutures held. The  paracervical tissues, parametrial tissues were successively clamped, cut and  suture ligated. The uterus was inverted and the upper pedicles clamped, cut  and suture ligated twice with those sutures held. The uterus and cervix were  then removed from the operative field. Hemostatic sutures were placed in the  right vaginal side wall allowing for adequate hemostasis. McCall culdoplasty  suture was placed in the posterior cul-de-sac incorporating the uterosacral  ligaments and the intervening posterior peritoneum. The vaginal angles were  created using the suture held from the uterosacral ligaments through the  anterior vaginal mucosa and posterior vaginal mucosa and tied down at each  angle. The vaginal cuff was then closed in a single layer incorporating  anterior vaginal mucosa, anterior peritoneum, posterior peritoneum and  posterior vaginal mucosa in figure-of-eight sutures. All sutures used were 0  Vicryl. Hemostasis was noted to be adequate. The McCall culdoplasty suture  was tied down. The vagina was packed with a  2 inch packing of plain packing moistened with Estrace cream. The patient  was then taken from the operating room to the recovery  room in satisfactory  condition having tolerated the procedure well with sponge and instrument  counts correct. The specimens to pathology were uterus and cervix.       VPH/MEDQ  D:  07/24/2005  T:  07/24/2005  Job:  [id number]

## 2011-05-18 NOTE — Discharge Summary (Signed)
NAMETYASHIA, MORRISETTE                       ACCOUNT NO.:  [id number]   MEDICAL RECORD NO.:  [id number]                   PATIENT TYPE:  INP   LOCATION:  9324                                 [facility]ILITY[facility]   PHYSICIAN:  Conni Elliot, M.D.             DATE OF BIRTH:  December 14, 1980   DATE OF ADMISSION:  08/20/2002  DATE OF DISCHARGE:  08/22/2002                                 DISCHARGE SUMMARY   ATTENDING PHYSICIAN AT TIME OF DISCHARGE:  Conni Elliot, M.D.   DISCHARGE DIAGNOSES:  1. Intrauterine pregnancy at 22-3/7 weeks.  2. Hyperemesis gravidarum.  3. Dehydration.  4. Anemia.   HOSPITAL COURSE:  The patient is a 31 year old G2, P0-1-0-1 at 22-3/7 weeks  who presented with persistent nausea and vomiting and dehydration.  The  patient has a long-history of nausea and vomiting with the current and past  pregnancy and numerous hospital admissions.  Please see previous H&P's for  full details.  On this admission, the patient was noted to have dark  black/coffee-ground type particles in her emesis and this was Gastroccult  positive for blood.  Concerns regarding possible esophageal tear lead to the  patient's admission.  The patient was placed on IV fluids with IV Phenergan  and had good relief in her nausea and vomiting.  IV hydration was  accomplished and the patient had good urine output and stable vital signs.  Gastroenterology consult was obtained secondary to the patient's protracted  nausea and vomiting to date as well as numerous admissions and Gastroccult  positive vomitus.  Dr. Matthias Hughs evaluated the patient and agreed with current  management including proton-pump inhibitors and Phenergan.  The patient has  been doing well overnight and is currently tolerating a clear liquid diet.  Her diet will be advanced, and if she tolerates this she will be discharged  to home.  The patient has been instructed that next week if she is still  feeling ill and nausea and vomiting  she should present to the MAU in the  morning for rehydration and to attempt to prevent recurrent admissions.  The  patient states she is agreeable to this.   FOLLOW UP:  She will follow up at [facility] on Thursday August 28th.   DISCHARGE MEDICATIONS:  She has been given a prescription for Protonix 40 mg  p.o. q.d.  She is to continue her prenatal vitamins which are currently  Flintstones and iron supplementation for her anemia.   LABORATORY DATA:  CBC on admission:  Hemoglobin 10.2, hematocrit 31.9, white  blood cell count 10.5, platelet count 437.  CBC at time of discharge:  Hemoglobin 9.0, hematocrit 27.7, white blood cell count 10.8, platelet 406.  Basic metabolic profile on admission was within normal limits aside from a  mild reduced potassium of 2.2.  Urinalysis was positive for ketones and  protein, rare bacteria and 0-2 white blood cells.  The patient was  guaiac-  negative for stool card and Gastroccult positive from vomitus.   REVIEW OF OLD MEDICAL RECORDS AND WORKUP:  The patient had an evaluation for  H. pylori antibody on June 19, 2002 which showed no significant level of IgG  antibodies to H. pylori detected.  This was a negative result.  Other  pertinent lab data:  The patient had an iron deficiency evaluation with  serum iron levels of 22 on June 24, 2002.  The patient also had TSH checked  which was within normal limits at 1.069, free T4 and T3 were also normal.  The patient's prealbumin obtained during two admissions revealed mildly  decreased at 12.0 mg/dL.  A urine drug screen in July of 2003 was negative.   DISCHARGE INSTRUCTIONS:  The patient is to follow up at [facility]  Thursday August 28th after discharge.  She is to return to MAU in the next 3-  4 days if continues to have nausea, vomiting, and decreased urine output.  She is to take Protonix p.o. q.d. as well as her Flintstone vitamins and  iron sulfate.   I appreciate Dr. Matthias Hughs assisting  with this case.     Morton Amy, M.D.    SG/MEDQ  D:  08/22/2002  T:  08/22/2002  Job:  [id number]   cc:   [facility]   Dr. Matthias Hughs @[facility]

## 2011-05-18 NOTE — H&P (Signed)
[name], [name]                       ACCOUNT NO.:  [id number]   MEDICAL RECORD NO.:  [id number]                   PATIENT TYPE:  OBV   LOCATION:  9304                                 FACILITY:  [facility]   PHYSICIAN:  Conni Elliot, M.D.             DATE OF BIRTH:  1980/09/29   DATE OF ADMISSION:  08/13/2002  DATE OF DISCHARGE:  08/14/2002                                HISTORY & PHYSICAL   CHIEF COMPLAINT:  Nausea and vomiting.   HISTORY OF PRESENT ILLNESS:  This is a 31 year old G2 P0-1-0-1 who is at 75  weeks estimated gestation age by EDD of December 22, 2002.  She has had  numerous hospitalizations during this and prior pregnancy secondary to  intractable nausea and vomiting.  She presented to [facility] clinic  this afternoon with continued nausea and vomiting and inability to maintain  p.o. intake.  The patient also complains of not having any bowel movement  since August 11, 2002.  The patient denies any hematemesis, diarrhea,  hematochezia, or melena.  She has tried a bland diet and Ensure supplements  without success.  The patient denies any abdominal pain.  She has noted good  fetal movement, and denies any contractions, vaginal bleeding, leakage of  fluid.   PAST MEDICAL HISTORY:  Significant for history of hyperemesis with prior  pregnancy, asthma, depression, previous pelvic inflammatory disease.   PAST SURGICAL HISTORY:  None.   REVIEW OF SYSTEMS:  As stated above.  The the patient denies any fever,  dysuria, back pain, headaches, leg edema.  Otherwise, review of systems is  negative.   OUTPATIENT MEDICATIONS:  The patient has been attempting to take prenatal  vitamins, iron, Phenergan suppositories, and Prozac.  However, she states  she is unable to take these secondary to her nausea and vomiting, and that  the suppositories are not working.   ALLERGIES:  No known drug allergies.   SOCIAL HISTORY:  The patient currently lives with her mother and  her young  child.  She denies alcohol and tobacco use.   PHYSICAL EXAMINATION:  VITAL SIGNS:  Temperature current 99, pulse 113,  respirations 20, blood pressure 99/62.  GENERAL:  The patient is alert and oriented x3, in no acute distress, lying  comfortably on bed.  HEENT:  Normocephalic, atraumatic.  Pupils equal, round, and reactive to  light.  Sclerae are nonicteric.  Nares are patent without discharge.  Oral  mucosa is pink and slightly tacky.  Pharynx is without erythema.  NECK:  Supple without lymphadenopathy.  There is no jugular venous  distention.  There are no carotid bruits, no thyromegaly.  LUNGS:  Clear to auscultation bilaterally.  Breathing is unlabored.  No  wheezes, rhonchi, or rales are auscultated.  CARDIOVASCULAR:  Normal S1, S2.  Tachycardia at approximately 110.  No  murmurs.  ABDOMEN:  Obese, positive bowel sounds, soft, nontender.  Difficult fundal  exam secondary  to obesity; however, fundus is nontender.  The patient does  complain of mild epigastric tenderness upon palpation of that region.  BACK:  No evidence of CVA tenderness.  SKIN:  Shows good turgor.  There are two dime-sized lesions on her right  cheek.   CURRENT LABORATORY DATA:  CBC:  Hemoglobin 11.3, hematocrit 31.9, platelet  count 439, white blood cell count 11.4.  Complete metabolic profile:  Sodium  142, potassium 2.2, chloride 108, bicarb 21, BUN 5, creatinine 0.7, glucose  80, AST 6, ALT 19, albumin 3.4.  Serum ketones small amount.  UA:  Specific  gravity greater than 1.020; positive for nitrates, leukocyte esterase, and  many bacteria.  Catheterized urine culture was sent; however, is currently  pending at the time of dictation.  Urine is positive for 40+ ketones.   ASSESSMENT AND PLAN:  A 31 year old African-American female with history of  hyperemesis, currently at 21 weeks of pregnancy.   1. Intractable nausea and vomiting with mild dehydration.  Will admit for     observation and IV  rehydration.  The patient has tried numerous     medications for her nausea and vomiting including Phenergan, Reglan, and     Pepcid.  She has not had success with any of these medications.  Will     initiate IV Pepcid therapy to decrease acidity and one dose of IV Reglan.     The patient does not wish to retry a GI cocktail or anti-ulcer     medications such as Tums.  Will try oral Protonix therapy after IV Pepcid     dose.  2. Hypokalemia.  Will replace KCl.  3. Possible urinary tract infection.  Urine culture will be sent from     catheterized UA and is currently pending at this time.  Will hold on     antibiotic therapy pending this culture.  4. Disposition.  Hopeful will discharge after IV rehydration.     Obstetrics Resident                       Conni Elliot, M.D.    OR/MEDQ  D:  08/14/2002  T:  08/15/2002  Job:  [id number]

## 2011-05-18 NOTE — Procedures (Signed)
[name], [name]             ACCOUNT NO.:  [id number]   MEDICAL RECORD NO.:  [id number]          PATIENT TYPE:  OUT   LOCATION:  SLEEP CENTER                 FACILITY:  [facility]   PHYSICIAN:  Clinton D. Maple Hudson, M.D. DATE OF BIRTH:  [birth year]   DATE OF STUDY:                              NOCTURNAL POLYSOMNOGRAM   REFERRING PHYSICIAN:  Dr. Donia Guiles.   DATE OF STUDY:  February 12, 2006.   INDICATION FOR STUDY:  Insomnia with sleep apnea.   EPWORTH SLEEPINESS SCORE:  4/24.   BMI:  44.7.   WEIGHT:  279 pounds.   NECK SIZE:  17 inches.   No medications listed.   SLEEP ARCHITECTURE:  Total sleep time 350 minutes with sleep efficiency 87%.  Stage I was 6%, stage II 75%, stages III and IV 9%, REM 9% of total sleep  time. Sleep latency 5 minutes, REM latency 214 minutes, awake after sleep  onset 49 minutes, arousal index 14. No bedtime medication reported.   RESPIRATORY DATA:  Apnea/hypopnea index (AHI, RDI) 8.7 obstructive events  per hour indicating mild obstructive sleep apnea/hypopnea syndrome. This  includes 3 obstructive apneas and 48 hypopneas. Events were not positional,  equally noted while supine and on left side. REM AHI 44.5 per hour.   OXYGEN DATA:  Mild to moderate intermittent snoring with oxygen desaturation  to a nadir of 82%. Mean oxygen saturation through the study was 98% on room  air.   CARDIAC DATA:  Normal sinus rhythm.   MOVEMENT/PARASOMNIA:  A total of 63 limb jerks were reported of which 13  were associated with arousal or awakening for a periodic limb movement with  arousal index of 2.2 per hour which is slightly increased.   IMPRESSION/RECOMMENDATIONS:  1.  Mild obstructive sleep apnea/hypopnea syndrome, AHI 8.7 per hour with      not positional events, mild to moderate snoring, oxygen desaturation to      a nadir of 82%.  2.  Scores in this range are occasionally appropriate for C-PAP therapy if      conservative measures including  weight      loss, treatment for nasal congestion, etc. are insufficient. Return for      C-PAP titration if needed.  3.  Periodic limb movement with arousal, very mild, 2.2 per hour.      Clinton D. Maple Hudson, M.D.  Diplomate, Biomedical engineer of Sleep Medicine  Electronically Signed     CDY/MEDQ  D:  02/17/2006 11:11:25  T:  02/17/2006 22:19:14  Job:  [id number]

## 2011-05-18 NOTE — H&P (Signed)
[name], [name]                       ACCOUNT NO.:  [id number]   MEDICAL RECORD NO.:  [id number]                   PATIENT TYPE:  INP   LOCATION:  9161                                 FACILITY:  [facility]   PHYSICIAN:  Crist Fat. Rivard, M.D.              DATE OF BIRTH:  September 25, 1980   DATE OF ADMISSION:  12/07/2003  DATE OF DISCHARGE:                                HISTORY & PHYSICAL   HISTORY OF PRESENT ILLNESS:  [name] is a 31 year old single black  female, gravida 3, para 2, 0-0-2 at [redacted] weeks gestation who presents  complaining of uterine contractions every 5 to 6 minutes for the last couple  of hours. She denies any leaking, but has seen a small amount of bloody  show. She denies any nausea or vomiting or visual disturbances. She does  report positive fetal movement. Her pregnancy has been followed  at [facility] by the physician service and has been essentially  complicated although at risk for:  1. Hyperemesis.  2. Obesity.  3. History of hypertension not requiring medications.  4. History of asthma.  5. Abnormal last menstrual period.  6. Late to care.  7. Desires bilateral tubal ligation for sterilization with this pregnancy.  8. Her group B Strep was negative.   OBSTETRIC HISTORY:  She is a gravida 3, para 2, 0-0-2, who delivered a  viable female infant in 2001, weight 6 pounds, 3 ounces at  [redacted] weeks  gestation following a 12 hour labor. She did have an epidural with that  labor and that pregnancy was complicated by hyperemesis. In 2004 she  delivered a viable female infant who weighed 7 pounds, 4 ounces at [redacted] weeks  gestation following a 12 hour labor. Also had an epidural and also had  hyperemesis with that pregnancy.   ALLERGIES:  No known drug allergies.   MEDICAL HISTORY:  She reports having  had the usual childhood diseases. She  reports a history of hypertension but has not required medications with this  pregnancy. She reports a history of  asthma but has not also required any  medications with this pregnancy for that. Her only surgery was wisdom teeth.  Her only hospitalizations have been for child birth and for hyperemesis.   FAMILY HISTORY:  Significant for maternal grandmother, maternal grandfather  with heart disease. Mother with enlarged heart  and maternal aunt with  congestive heart failure. Mother with hypertension. Maternal grandmother  with breast cancer. Maternal grandfather with  vocal cord cancer and  maternal aunt with skin cancer. Genetic history shows a maternal cousin with  sickle cell trait and another cousin with Down syndrome.   SOCIAL HISTORY:  She is single. The father of the baby is [name].  He is involved and supportive. She reports that she is of the [employer]. She denies any illicit drug use, alcohol or smoking with this  pregnancy.  LABORATORY DATA:  Her prenatal labs are blood type A positive, antibody  screen is negative. Sickle cell trait is negative. Syphilis is nonreactive.  Rubella immune. Hepatitis B surface antigen is negative. HIV is nonreactive.  GC and Chlamydia are both negative. Pap smear within normal limits. Her 1  hour Glucola was also within normal limits. Her beta strep at 36 weeks was  negative.   PHYSICAL EXAMINATION:  VITAL SIGNS:  Stable, afebrile.  HEENT:  Grossly  within normal limits.  HEART:  Regular rate and rhythm.  CHEST:  Clear.  BREASTS:  Soft, nontender.  ABDOMEN:  Gravid with uterine contractions every 5 to 7 minutes. Fetal heart  rate is reactive and reassuring.  PELVIC:  Her cervix examination is 4 cm, 80% bulging membranes, vertex and -  1 to -2 per Wynelle Bourgeois CNM.  EXTREMITIES:  Within normal limits.   ASSESSMENT:  1. Intrauterine pregnancy at term.  2. Early active labor.  3. Negative group B Strep.  4. History of hypertension.  5. Desires bilateral tubal ligation.   PLAN:  Admit to labor and delivery, to follow routine  doctor's orders and  Dr. Estanislado Pandy is notified of the patient's admission and will follow the  patient.     Concha Pyo. Duplantis, C.N.M.              Crist Fat Rivard, M.D.    SJD/MEDQ  D:  12/07/2003  T:  12/07/2003  Job:  [id number]

## 2011-05-18 NOTE — H&P (Signed)
[name], [name]             ACCOUNT NO.:  [id number]   MEDICAL RECORD NO.:  [id number]          PATIENT TYPE:  INP   LOCATION:  NA                            [facility]ACILITY:  [facility]   PHYSICIAN:  Hal Morales, M.D.DATE OF BIRTH:  03/30/1980   DATE OF ADMISSION:  DATE OF DISCHARGE:                                HISTORY & PHYSICAL   HISTORY OF PRESENT ILLNESS:  [name] is a 31 year old single African-  American female who is status post bilateral tubal ligation para 3-0-0-3 who  presents for hysterectomy because of refractory menorrhagia and anemia.  For  over four years the patient has experienced a heavy and at times irregular  menses.  Prior to her NovaSure endometrial ablation procedure in January of  2006 her menses lasted for seven days, occurred as many as three times per  months, and required an overnight pad plus super tampon change every 30-45  minutes.  As a result of this bleeding her hemoglobin dropped as low as 8.8.  Following the NovaSure procedure her menses were regular, but heavier  requiring the use of two overnight pads plus a super tampon change every 10-  45 minutes.  Additionally, patient's flow lasted for 14 days.  With all of  her bleeding episodes patient endured severe menstrual cramping which she  rates as a 10/10 on a 10-point scale with no relief from over-the-counter  nor prescription-strength nonsteroidal anti-inflammatory drugs.  She denies  any urinary tract symptoms, nausea, vomiting, diarrhea, fever, dyspareunia,  or vaginitis symptoms.  During the years of evaluation patient's TSH and  prolactin were within normal limits, gonorrhea and Chlamydia cultures  negative, and an endometrial biopsy in January 2006 showed benign  proliferative endometrium.  A pelvic ultrasound in May of 2005 showed her  uterus enlarged to 11.2 x 4.9 x 5.6 cm with two small intramural fibroids  both less than 1 cm.  Due to the protracted and disruptive nature of her  symptoms patient has decided to proceed with definitive therapy in the form  of hysterectomy.   PAST OBSTETRICAL HISTORY:  Gravida 3, para 3-0-0-3.  The patient experienced  hyperemesis and pregnancy induced hypertension.   PAST GYNECOLOGICAL HISTORY:  Menarche 31 years old.  Patient's last  menstrual period was July 10, 2005 the nature of which is as mentioned in  the history of present illness.  She uses bilateral tubal ligation as her  method of contraception.  She denies any history of sexually transmitted  diseases or abnormal Pap smear.  Her last normal Pap smear was December  2005.   PAST MEDICAL HISTORY:  1.  Hypertension.  2.  Anemia.  3.  Anovulation.  4.  Peptic ulcer disease.  5.  Migraines.   PAST SURGICAL HISTORY:  1.  December 2004 bilateral tubal ligation.  2.  January of 2006 NovaSure endometrial ablation procedure.   Patient being admits to being difficult to put under anesthesia.  Denies any  history of blood transfusions.   FAMILY HISTORY:  Positive for laryngeal cancer, breast cancer (mother age  48), hypertension, diabetes mellitus, heart disease, depression,  anxiety,  and sickle cell disease.   SOCIAL HISTORY:  Patient is single and she is unemployed.   HABITS:  She does not use tobacco or alcohol.   CURRENT MEDICATIONS:  Ferrous sulfate one tablet daily.   She has no known drug allergies.   REVIEW OF SYSTEMS:  Patient does have seasonal allergies, otherwise negative  except as mentioned in history of present illness.   PHYSICAL EXAMINATION:  VITAL SIGNS:  Blood pressure 130/82, weight over 350  pounds, height 5 feet 5 inches tall.  NECK:  Supple without masses.  There is no adenopathy or thyromegaly.  HEART:  Regular rate and rhythm.  There is no murmur.  LUNGS:  Clear to auscultation.  There are no wheezes, rales, or rhonchi.  BACK:  No CVA tenderness.  ABDOMEN:  Bowel sounds are present.  It is soft without tenderness,  guarding, rebound, or  organomegaly.  EXTREMITIES:  Without clubbing, cyanosis, edema.  PELVIC:  EGBUS is within normal limits.  Vagina is rugose.  Cervix is  nontender without lesions.  Uterus appears normal size, shape, and  consistency without tenderness.  However, examination is limited by body  habitus.  Adnexa without tenderness or masses.  Rectovaginal examination  without tenderness or masses.   IMPRESSION:  1.  Menorrhagia.  2.  Severe dysmenorrhea.  3.  History of anemia.  4.  Morbid obesity.   DISPOSITION:  A discussion was held with patient regarding indications for  her procedure along with its risks which include, but are not limited to,  reaction to anesthesia, damage to adjacent organs, infection, and excessive  bleeding.  Patient received a copy of the ACOG brochure entitled  Understanding Hysterectomy and Preparing For Surgery.  She has consented  to proceed with a total vaginal hysterectomy with the possibility of a total  abdominal hysterectomy at [facility] of [place] on July 24, 2005 at  9 a.m.       EJP/MEDQ  D:  07/19/2005  T:  07/19/2005  Job:  [id number]

## 2011-05-18 NOTE — Discharge Summary (Signed)
[facility]  Patient:    [name], [name] Visit Number: [id number] MRN: [id number]          Service Type: OBS Location: 910A [id number] 01 Attending Physician:  Leonard Schwartz Dictated by:   Saverio Danker, C.N.M. Admit Date:  06/03/2002 Discharge Date: 06/08/2002                             Discharge Summary  ADMISSION DIAGNOSES:          1. Intrauterine pregnancy at 10-11 weeks.                               2. Nausea and vomiting of pregnancy.                               3. Alleged verbalization of intent to commit                                  suicide if this continued.  DISCHARGE DIAGNOSES:          1. Improved depressive reaction.                               2. Stable nausea and vomiting.                               3. Intrauterine pregnancy at 11 weeks.  PROCEDURES:                   None.  HOSPITAL COURSE:              [name] is a 31 year old single black female, gravida 2, para 1-0-0-1, currently at [redacted] weeks gestation, who presents complaining of continue nausea and vomiting and reports that she has been unable to keep anything down for several days.  She has been seen or admitted several times already throughout early in her pregnancy for the same complaint.  She reports on presentation that she is frustrated with her living situation and is not sleeping well, is not eating well, and that she has had thoughts of killing herself but has not had any intention to act upon them. She was therefore admitted for suicidal ideation, to be observed and to also be hydrated and to continue to treat her nausea and vomiting of pregnancy. She has failed outpatient management of the same with Phenergan, Reglan, and Zofran, and the initial thought was to start her on a Reglan pump; however, since she has been hospitalized she has been able to keep a full regular diet down for the last two to three days with no nausea or vomiting.   During this hospitalization she has been evaluated by Dr. Jeanie Sewer and was started on psychotropic therapy with Prozac and has had a fairly good response to that with no further ideations to suicide and is tolerating a regular diet much better.  DISCHARGE DISPOSITION:        She is deemed by both OB and psychiatry to be ready for discharge today, and her discharge follow-up is scheduled at [facility] on Wednesday, June 11, at 8:15 a.m., to  be seen by [name].  She is also to follow up obstetrically at the high-risk clinic, and that appointment is to be set up.  DISCHARGE MEDICATIONS:        Prozac 15 mg one p.o. q.a.m. for the next two days, and then increase to 20 mg q.d. following that.  The prescription for the same is given to the patient.  The patient already has prescriptions for Phenergan, Zofran, and Reglan at home, to be taken p.r.n.  DISCHARGE LABORATORY DATA:    Her hemoglobin is 12.4, WBC count is 9.9, platelets are 533.  Her chemistries, her sodium is 136, her potassium is 3.8, and basically her chemistries were all within normal limits.  Her T4 is 12.2, T3 uptake was 22.1, TSH was 1.161, and her T3 was 211.  She had a negative urine drug screen, and her urinalysis was significant for positive nitrite on June 5 and positive for ketones on admission, June 4. Dictated by:   Vance Gather Duplantis, C.N.M. Attending Physician:  Leonard Schwartz DD:  06/08/02 TD:  06/10/02 Job: [id number]

## 2011-05-18 NOTE — Discharge Summary (Signed)
[facility]  Patient:    [name], [name] Visit Number: [id number] MRN: [id number]          Service Type: OBS Location: 9300 [id number] Attending Physician:  Enid Cutter Dictated by:   Clerance Lav, M.D. Admit Date:  07/02/2002 Discharge Date: 07/04/2002                             Discharge Summary  DATE OF BIRTH:                07-21-1980.  HISTORY OF PRESENT ILLNESS:   Ms. [name] is a 31 year old G2, P0-1-0-1, at 14-1/7 weeks, who was sent over from high-risk clinic with continued vomiting and dehydration.  She had a previous admission to [facility] on June 24 for six days for the same reason.  It is noted that the patient has had multiple admissions for IV hydration with this pregnancy.  MEDICATIONS:                  Prozac, Reglan, Pepcid.  PAST MEDICAL HISTORY:         The patient has a history of asthma and depression.  PAST OBSTETRICAL HISTORY:     Significant only for a spontaneous vaginal delivery x1 at 36 weeks.  HOSPITAL COURSE:              On admission the patient was found to be tachycardic at 108.  Her urine specific gravity was reportedly high, but I do not have the specific number at this time.  Blood pressure at admission was 132/80.  The patient was admitted for rehydration and management of the hyperemesis.  Since there was inability to establish a peripheral IV site, a PICC line was placed on the day of admission.  Throughout the hospital stay Ms. Manards emesis steadily improved with IV hydration and Phenergan.  An ultrasound for _____ x1 were performed on July 03, 2002, showed a singleton intrauterine pregnancy with no abnormalities noted.  Potassium was noted to be 3.3.  The patient was given a one-time dose of K-Dur 40 mEq p.o.  On day of discharge, the patient complained of a "fluttering feeling of her heart" that was associated with a little bit of shortness of breath but no chest pain.  An EKG was  performed and found to be normal sinus rhythm with no arrhythmia.  At discharge, Ms. Manards hydration status was improved.  The urinary incontinence specific gravity was 1.010.  There were no ketones.  There was a small amount of blood in the urine and because of this, the urine was sent for culture.  Outpatient IV hydration is arranged with [facility].  She is to receive 50 mg of Phenergan and 1000 cc of lactated Ringers at a rate of 40 cc/hr. through the PICC line.  [facility] is also to follow care of the PICC line.   The PICC line site was clean, dry, and intact without erythema or signs or symptoms of infection.  DISPOSITION:                  The patient is to be discharged home with home health follow-up as described above.  MEDICATIONS:                  No medicines with exception of the Phenergan to be given by home health were prescribed.  DISCHARGE INSTRUCTIONS:  She is to eat frequent small meals and snacks that are high-protein at night.  She is to arise from bed slowly, keep soda crackers at bedside, and eat before arising.  She is to follow up at high risk clinic on October 16, 2002, as previously scheduled.  She was advised to contact [facility] at [phone] with questions if needed.  PROBLEM LIST:                 1. Intrauterine pregnancy at 14-5/7 weeks.                               2. Hyperemesis with multiple hospitalizations.                               3. Dehydration, resolved at discharge.                               4. Peripherally inserted central catheter line                                  in place due to poor intravenous access.Dictated by:   Clerance Lav, M.D. Attending Physician:  Enid Cutter DD:  07/04/02 TD:  07/07/02 Job: [id number]

## 2011-05-18 NOTE — H&P (Signed)
[facility]  Patient:    [name]                     MRN: [id number] Adm. Date:  [id number] Attending:  Leonard Schwartz Dictator:   Miguel Dibble, C.N.M.                         History and Physical  DATE OF BIRTH:                1980/06/30.  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                                She had a urinalysis that showed 30 mg/dl of protein and 15 mg of ketones.  Her comprehensive metabolic profile was within normal limits.  Her hemoglobin is 11.2, hematocrit is 36.3, white count 10.5, platelets 490,000.                                She is admitted for 23-hour observation and IV hydration.  She will continue with prenatal care in the office.  MEDICAL HISTORY:              Asthma, uses an inhaler.  She believes the name of the inhaler is Atrovent.  The last time she used it was on January 06, 2000. She denies any STDs.  Usual childhood diseases.  Was told that she was anemic in the past.  Previously was prescribed birth-control pills by Dr. Pennie Rushing.  Denies smoking, alcohol, or drug abuse.  Denies any surgeries.  FAMILY HISTORY:               Not currently available.  SOCIAL HISTORY:               Teenager in high-school.  PHYSICAL EXAMINATION  HEENT:                        Within normal limits.  GENERAL HABITUS:              Grossly obese (apparently greater than 350 pounds).  LUNGS:                        Bilaterally clear.  CARDIAC:                      Heart regular rate and rhythm.  ABDOMEN:                      Slightly tender.  PELVIC EXAM:                  Deferred.  EXTREMITIES:                  Negative  edema.  ASSESSMENT:  1. Hyperemesis gravidarum at six weeks and                                  three days.                               2. Teenager.                               3. Asthma currently stable.                               4. Dehydration.  PLAN:                         1. Admit to labor and delivery for 23-hour                                  observation.                               2. For hydration.                               3. Phenergan.                               4. Antiemetics.                               5. Will discharge home when able to tolerate fluids. DD:  01/09/00 TD:  01/10/00 Job: [id number]

## 2011-05-18 NOTE — Discharge Summary (Signed)
   [name], [name]                       ACCOUNT NO.:  [id number]   MEDICAL RECORD NO.:  [id number]                   PATIENT TYPE:  INP   LOCATION:  9312                                 FACILITY:  [facility]   PHYSICIAN:  Bradly Bienenstock, M.D.                   DATE OF BIRTH:  05-15-1980   DATE OF ADMISSION:  09/23/2002  DATE OF DISCHARGE:  09/26/2002                                 DISCHARGE SUMMARY   ADDENDUM TO 16109:   DISCHARGE MEDICATIONS:  (Corrected)  1. Protonix as previously directed.  2. Prozac as previously directed.  3. Iron 325 mg one pill q.d.  4. Prenatal vitamins one pill q.d.  5. Phenergan 25 mg p.r. q.6h. p.r.n.                                               Bradly Bienenstock, M.D.    [name]  D:  09/26/2002  T:  09/28/2002  Job:  [id number]

## 2011-05-18 NOTE — Discharge Summary (Signed)
[name], [name]                       ACCOUNT NO.:  [id number]   MEDICAL RECORD NO.:  [id number]                   PATIENT TYPE:  INP   LOCATION:  9133                                 FACILITY:  [facility]   PHYSICIAN:  Enid Cutter, M.D.                  DATE OF BIRTH:  [birth year]   DATE OF ADMISSION:  08/03/2002  DATE OF DISCHARGE:  08/07/2002                                 DISCHARGE SUMMARY   DISCHARGE DIAGNOSES:  1. Intrauterine pregnancy at 20 and one-seventh weeks.  2. Mild malnutrition.  3. Hyperemesis gravidarum.   HOSPITAL COURSE:  The patient is a 31 year old G2 P0-1-0-1 who presented to  the maternity admission unit with a history of intractable nausea and  vomiting.  The patient has had numerous admissions in the past secondary to  similar problems during this pregnancy as well as during her previous  pregnancy.  On admission the patient's urine was noted to be significantly  concentrated as well as having significant ketones present.  The patient was  admitted for IV rehydration and nutritional evaluation.  The patient did  well throughout her entire hospitalization and the hospitalization was  uncomplicated.  She is currently able to take adequate oral intake without  persistent vomiting and is stable for discharge home.  A nutrition consult  was obtained for further evaluation of patient and they agree with current  management.  If the patient does continue to have persistent problems,  recommend a 24-hour calorie count and continued supplementation.  The  patient will be followed up at the high risk clinic one week after  discharge.   LABORATORY DATA:  Admission UA:  Specific gravity 1.030; positive for  bilirubin, ketones, and protein; otherwise negative.  Repeat UA on August 04, 2002 showed resolution of ketonuria.  Admission basic metabolic profile:  Sodium 135, potassium 3.3, chloride 107, bicarb 23, BUN 4, creatinine 0.7.  LFTs entirely within normal  limits.  Discharge complete metabolic profile  was entirely within normal limits, creatinine of 0.7, and resolution of  hypokalemia with a potassium of 3.7.  Prealbumin obtained during this  admission was decreased at 12 mg/dcl (normal range 18-45).   DISCHARGE INSTRUCTIONS:  The patient has a follow-up at high risk clinic in  one week.   MEDICATIONS:  She is to resume:  1. Prozac 20 mg p.o. q.d.  2. Phenergan 25 mg p.o. q.6h. p.r.n. nausea.  The patient has Phenergan     suppositories at home if she is unable to take p.o. and she is to use     these q.6h. p.r.n. nausea.   DIET:  Maintain adequate fluid hydration and to continue Ensure supplements  twice a day.   ACTIVITY:  There are no restrictions on her activity.    SPECIAL INSTRUCTIONS:  The patient's discharge weight is 231 pounds.  She  should be weighed weekly at high risk clinic.  Quintin Alto, M.D.                        Enid Cutter, M.D.    SG/MEDQ  D:  08/06/2002  T:  08/11/2002  Job:  [id number]   cc:   [facility]

## 2011-05-18 NOTE — Discharge Summary (Signed)
[name], [name]                       ACCOUNT NO.:  [id number]   MEDICAL RECORD NO.:  [id number]                   PATIENT TYPE:  INP   LOCATION:  9156                                 [facility]ACILITY:  [facility]   PHYSICIAN:  Janine Limbo, M.D.            DATE OF BIRTH:  [birth year]   DATE OF ADMISSION:  11/07/2003  DATE OF DISCHARGE:  11/09/2003                                 DISCHARGE SUMMARY   ADMISSION DIAGNOSES:  1. Intrauterine pregnancy at 34-4/7 weeks.  2. Third trimester bleeding.   DISCHARGE DIAGNOSES:  1. Proteinuria secondary to pregnancy induced hypertension versus persistent     urinary tract infection.  2. Third trimester bleeding never documented, questionable hematuria.   HOSPITAL PROCEDURES:  1. Electronic fetal monitoring.  2. Obstetrical ultrasound.  3. 24-hour urine collection.   HOSPITAL COURSE:  The patient was admitted with complaint of vaginal  bleeding on November 07, 2003.  She reported positive fetal movement,  irregular contractions, and no other symptoms.  She had been seen a week ago  for nausea and vomiting and decreased fetal movement and a UTI.  She was  treated with Ancef and Macrobid.  She reports taking her medication and  having only a couple of pills left.  Denies recent intercourse.  GC,  Chlamydia cultures were negative on October 18, 2003.  Speculum examination  on admission showed no blood in the vaginal vault, creamy white discharge.  Fetal fibronectin was negative.  Group B Strep was negative.  Cervix was 1  cm long and thick and with the presenting part high.  There were mild  irregular contractions and a reassuring fetal heart rate.  The patient was  catheterized for a urinalysis and culture.  She received a complete OB  ultrasound on November 08, 2003.  24-hour urine collection was begun for  measurement of protein.  PIH laboratories were all within normal limits.  UA  showed positive nitrites and 1+ protein.  The patient denied  any symptoms of  UTI.  There is no recurrence of bleeding and no bleeding had been observed  during the entire hospital stay.  Fetus was active.  Fetal heart rate was  reactive and the patient was deemed to have received the full benefit of her  hospital stay and was discharged home.   DISCHARGE MEDICATIONS:  1. Septra DS one p.o. b.i.d.  2. Prenatal vitamins.   DISCHARGE LABORATORIES:  Urinalysis showed ketones 15, total protein 100,  nitrite positive.  Wet prep was negative.  Chemistries were within normal  limits except for an alkaline phosphatase of 118.  Uric acid was 4.3.  RPR  pending.  White blood cell count 6.1, hemoglobin 10.4, platelets 373,000.    DISCHARGE INSTRUCTIONS:  Completion of 24-hour urine.  Take Septra for UTI.   DISCHARGE FOLLOWUP:  Thursday, November 11, 2003.     Marie L. Williams, C.N.M.  Janine Limbo, M.D.    MLW/MEDQ  D:  11/09/2003  T:  11/09/2003  Job:  [id number]

## 2011-05-18 NOTE — Op Note (Signed)
[facility]  Patient:    [name], [name]                    MRN: [id number] Adm. Date:  [id number] Attending:  Pleas Koch                           Operative Report  PREOPERATIVE DIAGNOSES:       1. Active labor at term.                               2. Second stage bradycardia.  POSTOPERATIVE DIAGNOSES:      1. Active labor at term.                               2. Second stage bradycardia.                               3. Bilateral upper labial laceration.  PROCEDURE:                    Vacuum extraction vaginal delivery with repair of bilateral upper labial laceration.  OBSTETRICIAN:                 Janine Limbo, M.D.  ANESTHESIA:                   Epidural.  DISPOSITION:                  [name] is a 31 year old female who presents at [redacted] weeks gestation gravida 1, para 0 with ruptured membranes on August 19, 2000.  The patient completely dilated her cervix and began to push appropriately.  She began having deep variable decelerations followed by deep variable decelerations with late return to the baseline.  Options for management were reviewed including continued pushing, forceps vaginal delivery, vacuum extraction vaginal delivery, and cesarean delivery.  The risks and benefits of each of these were discussed.  The patient, her husband, and her mother decided to proceed with vacuum extraction vaginal delivery. The specific risks of vacuum extraction vaginal delivery were reviewed including a risk of caput formation, hematoma formation, the rare risk of intracranial bleeding, and the risk that the vacuum extraction would be unsuccessful and that we would still need to proceed with cesarean delivery.  FINDINGS:                     The weight of the infant is currently not known. A healthy female was delivered (Nia) with Apgars of 7 at one minute and 9 at five minutes.  There was a tight nuchal cord present.  There was a  three vessel cord present.  The placenta was normal.  There were bilateral upper labial lacerations present.  There were no perineal, vaginal, or cervical lacerations.  PROCEDURE:                    The patient was placed in a lithotomy position. The perineum was prepped with multiple layers of Betadine and then sterilely draped.  The fetal head was noted to be in an occiput anterior position.  The cervix was completely dilated and 100% effaced.  The fetal head was at a +3 station and  the head could be seen without spreading the labia.  The Kiwi vacuum extractor was placed on the fetal head and the patient pushed.  One pop-off occurred.  With two pushes the fetal head was delivered without difficulty.  The mouth and nose were suctioned.  The tight nuchal cord was clamped on the perineum and cut.  The remainder of the infant was delivered. Apgars were 7 at one minute and 9 at five minutes.  The infant was allowed to bond with the mother.  The infant was placed on the abdomen and then taken to the warmer for further care.  The lacerations were repaired using figure-of-eight sutures of 0 Chromic catgut.  The placenta was removed.  Prior to removal of the placenta, routine cord blood studies were obtained.  The upper vagina was inspected and no lacerations were present.  The patient was returned to the supine position and allowed to recover in the labor and delivery suite.  The infant was allowed to stay in the room with the mother. DD:  08/20/00 TD:  08/20/00 Job: [id number]

## 2011-05-18 NOTE — Discharge Summary (Signed)
[facility]  Patient:    [name], [name]                    MRN: [id number] Adm. Date:  [id number] Disc. Date: [id number] Attending:  Tammi Sou Dictator:   Raynelle Jan, M.D.                           Discharge Summary  HISTORY OF PRESENT ILLNESS:   This is a 31 year old, gravida 1, para 0, at 11-1/7 who was seen in maternity admissions unit for nausea and vomiting.  On the day f admission, she states that she has been vomiting and not been able to keep anything on her stomach.  MEDICATIONS:                  Albuterol inhaler p.r.n.  ALLERGIES:                    No known drug allergies.  PAST MEDICAL HISTORY:         She has had multiple visits with this pregnancy for hyperemesis and dehydration.  SOCIAL HISTORY:               She lives with her mother.  She is a International aid/development worker.  PHYSICAL EXAMINATION:         On admission her temperature was 97.2, pulse 137,  respiratory rate 20, blood pressure 117/89.  This is an obese black female who as spitting in a cup.  LUNGS: Clear.  HEART: Tachycardic.  BACK: Without CVA tenderness.  ABDOMEN: Soft.  Tender in the right upper quadrant epigastric area. Positive bowel sounds.  LABORATORY DATA:              Her cath UA was significant for greater than 80 ketones, 100 glucose, 100 protein, positive nitrite.  Her comprehensive metabolic panel was significant for a potassium of 2.9 and AST of 41, ALT of 64.  She was  admitted to [facility] on February 11, 2000, with the diagnosis of hyperemesis.  She was rehydrated, given potassium in her IV fluids to correct the hypokalemia.  Home Health and social work was consulted to assist with rehydration after discharge in order to keep the patient out of the hospital. Nutrition also came by to assess nutritional status and also support for the patient.  On the day of discharge, the patient had less emesis.   Her hypokalemia had resolved with a discharge potassium of 3.9.  Her urine output had increased. Her liver enzymes were still slightly elevated with an ALT of 38 and an AST of 0. Her urine culture showed no growth.  She was therefore discharged to home with follow-up.  FOLLOW-UP:                    She is to follow up at [facility] on February 22, at 2 p.m.  She is also to follow up with Home Health who is to come to her house to provide IV fluids and assess for dehydration status.  At her follow-up  visit on February 22, she will need a repeat urinalysis to assess if the glucose is still in her urine and will also need a repeat metabolic panel to follow her potassium.  DISCHARGE MEDICATIONS:        1. Continue her prenatal vitamins.  2. 40 mEq of K-Dur to be taken on the day after                                  discharge in order to keep her potassium within                                  normal limits.  DISCHARGE DIET:               She is to remain on clear liquids and a bland diet. Have many small meals during the day and a keep a small amount of food or liquid in her stomach at all times. DD:  02/13/00 TD:  02/14/00 Job: [id number]

## 2011-05-18 NOTE — Discharge Summary (Signed)
NAMEVERALYN, LOPP                       ACCOUNT NO.:  [id number]   MEDICAL RECORD NO.:  [id number]                   PATIENT TYPE:  INP   LOCATION:  9133                                 FACILITY:  [facility]   PHYSICIAN:  Clerance Lav, M.D.               DATE OF BIRTH:  May 23, 1980   DATE OF ADMISSION:  07/25/2002  DATE OF DISCHARGE:  07/30/2002                                 DISCHARGE SUMMARY   PERTINENT LABORATORIES:  At discharge, potassium was 3.9.  Clostridium  difficile negative.  Hemoglobin 9.3, white blood cells 8.6.  BUN 2,  creatinine 0.6.  Her weight at discharge was 340 pounds.   PROCEDURE:  Routine complete ultrasound for anatomy scan done, showed a  singleton intrauterine pregnancy in cephalic position.  Amniotic fluid was  subjectively normal with a vertical pocket of 4.8.  Placenta was posterior  with no previa.  The fetal biometry appeared symmetrical.  Study was limited  by maternal habitus; however, most fetal anatomy was visualized with the  exception of a three-vessel cord.  Others that were not visualized this time  had been previously visualized.   BRIEF HOSPITAL COURSE:  The patient is a 31 year old gravida 2, para 1,  admitted on March 26, 2002, at approximately [redacted] weeks gestation with  complaint of constant nausea and vomiting for  1-2 days.  She has had a  history of multiple visits to the MAU with multiple admissions for IV  hydration and antiemetics.  She denies chills, fever, or abdominal pain.  She had previously been on Thorazine suppositories.  She says that she felt  that the suppositories that she had had in the hospital worked well but the  ones that she called generic which were made by [facility] did  not seem to work.   On admission her urine had specific gravity of 1.035, 15 ketones, plus  nitrites, no LE.  She was admitted and started on IV hydration and IV  Phenergan.   Throughout the hospital course, patient improved with  an advancement of diet  to regular.  She was started on a scopolamine patch on July 28, 2002.  She  tolerated this well with only symptoms of some nausea.  On July 28, 2002,  the patient developed some diarrhea which persisted for about a day and then  slowly tapered off.  Because of antibiotic use at a prior admission, patient  was tested for C. difficile and this was negative.   At discharge, patient was tolerating a regular diet.  Her vital signs were  stable.  She feels like she is ready and willing to go home.  She is  afebrile.  Her laboratories were within normal limits at discharge.  Her  potassium was 3.9.   DISCHARGE INSTRUCTIONS:  The patient is discharged home with two scopolamine  patches in hand and a prescription for scopolamine patch.  I called the  pharmacy that she normally uses and they do have a supply there.  She was  given a prescription for chewable prenatal vitamins to take one q.d.  For  the scopolamine patch, she is to apply it behind the ear and replace with a  new patch every three days.  She is to remove the old patch prior to putting  a new patch on and she was instructed to change the first patch on the day  after discharge which is August 1.   DIET:  Unrestricted; however, recommended to eat small, frequent meals that  are rich in protein.    ACTIVITY:  She is to take it as easy as possible.  She is to have followup  care at [facility] and appointment scheduled on August 05, 2002 at  9:00.  She is also going to be followed by [facility] who will  follow her urine at that time.                                               Clerance Lav, M.D.    CH/MEDQ  D:  07/30/2002  T:  08/05/2002  Job:  [id number]

## 2011-05-18 NOTE — Discharge Summary (Signed)
   [name], [name]                       ACCOUNT NO.:  [id number]   MEDICAL RECORD NO.:  [id number]                   PATIENT TYPE:  INP   LOCATION:  9126                                 [facility]ACILITY:  [facility]   PHYSICIAN:  Nani Gasser, M.D.            DATE OF BIRTH:  1980-06-24   DATE OF ADMISSION:  10/08/2002  DATE OF DISCHARGE:  10/14/2002                                 DISCHARGE SUMMARY   DISCHARGE DIAGNOSES:  1. Dehydration.  2. Hyperemesis gravidarum in late pregnancy.  3. Hypokalemia.  4. Anemia.  5. Depression.   DISCHARGE MEDICATIONS:  1. Protonix.  2. Prenatal vitamins q.d.  3. Phenergan p.r.n.  4. Prozac as previously prescribed.   HOSPITAL COURSE:  The patient was a 31 year old G2, P0-1-0-1 who presented  at 28 weeks by last menstrual period.  The patient's chief complaint was  vomiting and a 10 pound weight loss over the past week.  The patient was  admitted and the following problems were addressed during admission.   PROBLEM #1 -  DEHYDRATION:  The patient was given several boluses of IV  fluids and put on a maintenance drip.   PROBLEM #2 - HYPEREMESIS GRAVIDARUM IN LATE PREGNANCY:  The patient was  given multiple antiemetics including Phenergan and Zofran to try to relieve  her vomiting.  By discharge, the patient was able to keep p.o. down well  without any vomiting.  The patient was also started on Reglan in house to  help with GI motility.  The patient was also tested for H. pylori.  The  level was insignificant, thus, it was not treated.   PROBLEM #3 - HYPOKALEMIA:  The patient's potassium was replaced p.r.n.  This  was most likely secondary to patient's hyperemesis.   PROBLEM #4 - ANEMIA:  The patient was continued on her daily iron dose and  discharged on this.   PROBLEM #5 - DEPRESSION:  The patient did have a very flat affect and  disconnected disposition. The patient was continued on her Prozac in house  and should continue this at home.   Would consider ruling out an eating  disorder in this patient as well.                                               Nani Gasser, M.D.    CM/MEDQ  D:  12/08/2002  T:  12/08/2002  Job:  [id number]

## 2011-05-18 NOTE — Discharge Summary (Signed)
[name], [name]                       ACCOUNT NO.:  [id number]   MEDICAL RECORD NO.:  [id number]                   PATIENT TYPE:  INP   LOCATION:  9102                                 [facility]   PHYSICIAN:  Douglass Rivers, M.D.                DATE OF BIRTH:  04-10-1980   DATE OF ADMISSION:  05/09/2003  DATE OF DISCHARGE:  05/14/2003                                 DISCHARGE SUMMARY   DISCHARGE DIAGNOSES:  98. A 31 year old G3, P2-0-0-2 with a Intrauterine pregnancy at 110 and 1/7     weeks.  2. Hyperemesis.  3. History of depression.   DISCHARGE MEDICATIONS:  1. Meclizine 25 mg p.o. q.6h.  2. Protonix 40 mg p.o. daily.  3. B-6 25 mg p.o. t.i.d.  4. Prednisone taper.   DISCHARGE INSTRUCTIONS:  Patient is to [facility] for  routine followup.  She is to return to [facility] if she has further difficulties  or issues with emesis.   HOSPITAL COURSE:  The patient is a 31 year old G3, P2-0-0-2 well known to  the teaching service for management of hyperemesis with her previous  pregnancies who presented at this time with complaint of nausea, vomiting x  1 week.  She had been seen in the [facility] ED on May 5 and had a positive  urine pregnancy test and OB ultrasound that was consistent with an 8 week 1  day pregnancy.  Her LMP was March 03, 2003 which gives her estimated  gestational age of 32 and 3/7ths at the time of admission.   MEDICATIONS:  Albuterol as needed.   PAST MEDICAL HISTORY:  1. She has had two spontaneous vaginal deliveries and has had refractory     hyperemesis with both pregnancies.  2. She has a history of hypertension.  3. Asthma.   ADMISSION PHYSICAL AND EXAMINATION:  VITAL SIGNS:  Her temperature was 97.3,  pulse 86, respirations 18, blood pressure 107/79.  GENERAL:  She was a well-appearing, obese female, black female in no acute  distress having vomited several times.  ABDOMEN:  Morbidly obese.  Soft and nontender with positive  bowel sounds in  all four quadrants.   LABORATORY DATA:  Urinalysis revealed a specific gravity of 1.025 with 15 of  ketones.  She had 100 of protein.  Complete metabolic panel was within  normal limits.  White count 8.2, hemoglobin 12.5.   HOSPITAL COURSE:  Problem 1.  In the MAU she was given a liter of IV fluids  and was unable to tolerate clear liquids and persisted in having yellow  colored emesis.   Problem 2.  On May 09, 2003 she was admitted by Dr. Senaida Ores who was on  call for GYN for treatment and management of hyperemesis.  Later that day  she was transferred to the [facility] teaching service due to familiarity with her  care.   Problem 3.  Patient received IV fluids was started on Solu-Medrol taper and  continued to have persistent vomiting.  On hospital day #3 her old clinic  record reviewed and discovered that the medicines that had worked with her  previous pregnancy were Meclizine, Protonix and B-6.  She was restarted on  this and 24 hours prior to discharge she had no vomiting while tolerating a  regular diet.  Of note she did have an ultrasound done which revealed a  single normal uterine pregnancy.   DISPOSITION:  The patient was discharged on May 14, 2003 with the previously  described prescriptions and instructions.                                                 Douglass Rivers, M.D.    CH/MEDQ  D:  05/14/2003  T:  05/14/2003  Job:  [id number]

## 2011-05-18 NOTE — Discharge Summary (Signed)
[name], [name]                       ACCOUNT NO.:  [id number]   MEDICAL RECORD NO.:  [id number]                   PATIENT TYPE:  INP   LOCATION:  9312                                 [facility]   PHYSICIAN:  Phil D. Okey Dupre, M.D.                  DATE OF BIRTH:  21-Oct-1980   DATE OF ADMISSION:  09/23/2002  DATE OF DISCHARGE:  09/26/2002                                 DISCHARGE SUMMARY   DISCHARGE DIAGNOSES:  1. Intrauterine pregnancy at [redacted] weeks gestation.  2. Persistent hyperemesis of pregnancy.  3. Depression.  4. History of anxiety disorder.  5. Anemia, hemoglobin 8.1 on discharge.   DISCHARGE MEDICATIONS:  1. Prenatal vitamins one q.d.  2. Phenergan 25 mg suppository per rectum q.6 h. p.r.n.  3. Iron FeSO4 one tab p.o. q.d.   FOLLOW UP:  The patient is to [facility] this Thursday,  October 2nd.  We have recommended the [facility] for the patient  followup q. week there.   PROCEDURES IN HOSPITAL:  None.   HISTORY OF PRESENT ILLNESS:  This is a 31 year old G2, P 0-1-0-1, who  presented to Maternity Admissions at 25 weeks 6 days gestation based on a  first trimester ultrasound plus her last menstrual period who complained of  two days of nausea and vomiting.  The patient is very well known to our  service secondary to persistent nausea and vomiting of pregnancy with over  10 hospitalizations for this complaint during this pregnancy.   MEDICATIONS:  The patient is on albuterol, Prozac, and Protonix.   ALLERGIES:  No known drug allergies.   OBSTETRICAL HISTORY:  A spontaneous vaginal delivery in September of 2001 of  a little girl.  Persistent vomiting throughout that pregnancy as well.   GYNECOLOGICAL HISTORY:  None.   PAST MEDICAL HISTORY:  1. Asthma.  2. Anemia.  3. Hypertension not requiring medications.   PHYSICAL EXAMINATION:  VITAL SIGNS:  On admission, temperature 100.0, pulse  111, respiratory rate 20, blood pressure  142/90.  On recheck 127/78.  Weight  318 pounds.  GENERAL:  An alert and oriented African-American female in no acute  distress.  CARDIOVASCULAR:  Regular rate and rhythm without murmur.  LUNGS:  Clear to auscultation bilaterally.  ABDOMEN:  Very obese, gravid.  FETAL HEART MONITORING:  Fetal heart tones baseline 150-155, good  variability.  No contractions on the monitor.   LABORATORY DATA:  Urinalysis showed 40 of ketones, 30 of protein, positive  nitrites, specific gravity 1.025.   HOSPITAL COURSE:  Assessment at that time was this was nausea and vomiting,  questionable continued hyperemesis at 25 weeks 6 days gestation, mild  dehydration.  The patient was admitted for rehydration and to get her nausea  and vomiting under control.  The patient was admitted to the hospital and  had really no more complications from vomiting.  Initial  labs showed she did  have a potassium of 2.8.  This was repleted and on recheck was 3.5.  The  patient used PR Phenergan while in the hospital and did well with this, and  on discharge was tolerating a regular diet.  She was therefore discharged to  home with p.r.n. Phenergan.   RECOMMENDATIONS:  [facility]up at [facility] is our recommendation and  that she is seen weekly as her problems seems to arise on the weeks when she  is not being seen at the [facility].   DISCHARGE LABORATORY DATA:  Hemoglobin 8.1, white count 10.9, platelets 366.  Prealbumin 12. 2, potassium 3.5.     Bradly Bienenstock, M.D.                         Phil D. Okey Dupre, M.D.    [name]  D:  09/26/2002  T:  09/28/2002  Job:  [id number]   cc:   [facility]

## 2011-05-18 NOTE — H&P (Signed)
[facility]  Patient:    [name], [name] Visit Number: [id number] MRN: [id number]          Service Type: OBS Location: 9300 [id number] Attending Physician:  Leonard Schwartz Dictated by:   Marcelle Smiling Clelia Croft, C.N.M. Admit Date:  06/03/2002                           History and Physical  DATE OF BIRTH:                [birth year]  CHIEF COMPLAINT:              [name] is a 31 year old female, gravida 2 para 0, 1-0-1, who presented to maternal admissions complaining of nausea and vomiting due to pregnancy.  The patient is at [redacted] weeks gestation and has had multiple visits to [facility] of [place] secondary to dehydration and nausea and vomiting.  While in maternal admissions the patient made a suicidal ideation statement with a plan, but later stated passive thoughts and said she would not act on them.  The patient would not voluntarily admit herself to [facility] but said she wished to stay at [facility] of [place].  With her previous visits to maternal admissions she has been treated with Reglan, Zofran, and Phenergan on an outpatient basis for her hyperemesis.  PHYSICAL EXAMINATION:  VITAL SIGNS:                  Stable.  She is afebrile.  HEENT:                        Grossly within normal limits.  CHEST:                        Clear to auscultation.  HEART:                        Regular rate and rhythm.  ABDOMEN:                      Remarkable for positive fetal heart tones.  PELVIC:                       Examination deferred.  LABORATORY DATA:              Clean-catch urinalysis shows greater than 80 ketones and positive Icto test, as well as 100 protein.  ASSESSMENT:                   1. Intrauterine pregnancy at 11 weeks.                               2. Hyperemesis.                               3. Suicidal thoughts.  PLAN:                         1. Admit for  23 hour observation per  consult with Dr. Stefano Gaul.                               2. Suicide precautions in place.                               3. Psychiatric consult.                               4. IV fluid therapy. Dictated by:   Marcelle Smiling Clelia Croft, C.N.M. Attending Physician:  Leonard Schwartz DD:  06/04/02 TD:  06/05/02 Job: [id number]

## 2011-05-18 NOTE — Op Note (Signed)
NAMELORELEE, MCLAURIN             ACCOUNT NO.:  [id number]   MEDICAL RECORD NO.:  [id number]          PATIENT TYPE:  AMB   LOCATION:  SDC                           [facility]  [facility]   PHYSICIAN:  Hal Morales, M.D.DATE OF BIRTH:  [birth year]   DATE OF PROCEDURE:  01/11/2005  DATE OF DISCHARGE:                                 OPERATIVE REPORT   PREOPERATIVE DIAGNOSIS:  Menorrhagia.   POSTOPERATIVE DIAGNOSIS:  Menorrhagia.   OPERATION:  Hysteroscopy, D&C, Novasure endometrial ablation.   SURGEON:  Hal Morales, M.D.   ANESTHESIA:  Spinal.   ESTIMATED BLOOD LOSS:  Less than 10 cc.   COMPLICATIONS:  None.   FINDINGS:  At hysteroscopy,  there were a small amount of endometrial tissue  but no endometrial lesions were noted. The cervical length measured 3 cm.  The total uterine sounding length was 9 cm.   PROCEDURE:  The patient was taken to the operating room after appropriate  identification and placed on the operating table. After the placement of a  spinal anesthetic,  she was placed in the supine position and then in the  lithotomy position. The perineum and vagina were prepped in multiple layers  of Betadine and a red Robinson catheter used to empty the bladder. The  perineum was draped in a sterile field. A Graves speculum was placed in the  vagina and a single-tooth tenaculum placed on the anterior cervix. The  cervical length was measured and then the uterus sounded. The cervix was  then dilated to accommodate the diagnostic hysteroscope. The hysteroscope  was used to document that no intracavitary lesions were present. The  hysteroscope was removed and the cervix dilated to accommodate the Novasure  apparatus. The Novasure was then set at 6 cm and placed into the endometrial  cavity. The array was opened with an initial width of  3.5. After a CT of  the Novasure apparatus,  the cavity width increased to 4.1 cm. The cervical  field was then pushed in and the initial  test of endometrial integrity was  undertaken and noted to be intact. The endometrial ablation was then begun  and completed after 1 minute and 20 seconds. The endometrial array was then  closed and the Novasure apparatus removed and the endometrial cavity. The  hysteroscope was used to then visualize the ablated endometrial cavity. All  instruments were then removed from the vagina and the area of bleeding on  the cervix from the tenaculum was cauterized with silver nitrate. Hemostasis  was noted to be adequate and the patient was taken from the operating room  to the recovery room in satisfactory condition having tolerated the  procedure well with sponge and instrument counts correct.   SPECIMENS TO PATHOLOGY:  Endometrial curettings which were obtained  immediately after the hysteroscopy with a suction machine.     Colin Ina   VPH/MEDQ  D:  01/11/2005  T:  01/11/2005  Job:  [id number]

## 2011-05-18 NOTE — H&P (Signed)
[name], [name]             ACCOUNT NO.:  [id number]   MEDICAL RECORD NO.:  [id number]          PATIENT TYPE:  AMB   LOCATION:  SDC                           [facility]   PHYSICIAN:  Hal Morales, M.D.DATE OF BIRTH:  06-15-1980   DATE OF ADMISSION:  DATE OF DISCHARGE:                                HISTORY & PHYSICAL   HISTORY OF PRESENT ILLNESS:  Ms. Forsee is a 31 year old single African-  American female, para 3-0-0-3 who is status post bilateral tubal ligation  who presents for an endometrial ablation because of menorrhagia and  subsequent anemia. For the past four years, the patient had experienced  menstrual flow, five to seven days, as many as three times monthly in which  she changes an overnight pad plus a super tampon every 30 to 45 minutes.  Because of this, patient's hemoglobin had dropped as low as 8.8.  Additionally, she experiences what she describes as 10/10 severe menstrual  cramping which is not relieved with nonsteroidal anti-inflammatory medicines  nor Tylenol #3. She denies any intermenstrual pain, dyspareunia, change in  her bowel habits, urinary tract symptoms, vaginitis symptoms, or fever. In  April of 2005, the patient was found to have a normal TSH, prolactin, and  negative gonorrhea and Chlamydia cultures. A pelvic ultrasound in May of  2005 revealed a mildly enlarged uterus measuring 11.2 cm in length by 4.9 cm  in depth and 5.6 cm in width. The myometrium was homogenous, accompanied by  two tiny hypoechoic areas in the anterior myometrium consistent with  fibroids measuring 6 and 4 mm in size respectively. Her endometrial was tri-  layered and 11.2 mm in thickness which was normal for her place in her  menstrual cycle. There was no free fluid which was observed, and her right  ovary measured 4.0 x 2.5 x 2.3 cm, containing a 2.3-cm follicle, and the  left ovary measuring 4.9 x 2.5 x 2.6 cm and containing a 2.1-cm follicle. On  many  occasions, the patient had reviewed with her as well as most recently  the options for management of her symptoms to include oral contraceptives,  endometrial ablation, and hysterectomy. After consideration of the risks and  benefits of each of these options, the patient had consented to proceed with  endometrial ablation using the Novasure procedure.   OB HISTORY:  Gravida 3, para 3-0-0-3. The patient experienced hyperemesis  and pregnancy-induced hypertension.   GYN HISTORY:  Menarche 31 years old. The patient's menstrual period is as  per history of present illness. Her last menstrual period was December 09, 2004. She uses bilateral tubal ligation as her method of contraception.  Denies any history of sexually transmitted diseases or abnormal Pap smears.  Last normal Pap smear was December 2005.   PAST MEDICAL HISTORY:  1.  Hypertension.  2.  Anemia.  3.  Asthma.  4.  Anovulation.  5.  Peptic ulcer disease.  6  migraines.   PAST SURGICAL HISTORY:  December 2004 bilateral tubal ligation.   FAMILY HISTORY:  Positive for laryngeal cancer, breast cancer (mother age  42), hypertension,  diabetes mellitus, heart disease, depression, anxiety,  and sickle cell disease.   SOCIAL HISTORY:  The patient is single, and she is unemployed.   HABITS:  She does not use tobacco or alcohol.   CURRENT MEDICATIONS:  None.   ALLERGIES:  No known drug allergies.   REVIEW OF SYSTEMS:  Positive for seasonal allergies and as mentioned in  history of present illness. Otherwise negative.   PHYSICAL EXAMINATION:  VITAL SIGNS:  Blood pressure 124/82, weight 250+  pounds, height 5 feet 5 inches tall.  NECK:  Supple. There are no masses, adenopathy, or thyromegaly.  HEART:  Regular rate and rhythm. There is no murmur.  LUNGS:  Clear to auscultation. There are no wheezes, rales, or rhonchi.  BACK:  No CVA tenderness.  ABDOMEN:  Bowel sounds are present and is soft without tenderness, guarding,  or  appreciable organomegaly (exam is limited by body habitus.  EXTREMITIES:  Without clubbing, cyanosis, or edema.  PELVIC:  EGBUS is within normal limits. Vagina is rugose. Cervix is  nontender without lesions. Uterus is unable to be assessed secondary to  patient's body habitus. Adnexa without tenderness or masses. Rectovaginal  without tenderness or masses.   IMPRESSION:  1.  Menorrhagia.  2.  History of anemia.  3.  Morbid obesity.  4.  Dysmenorrhea.   DISPOSITION:  A discussion was held with patient regarding the indications  for her procedure along its risks which include but are not limited to  reaction to anesthesia, damage to adjacent organs, infection, and excessive  bleeding.  It was also made clear to the patient that this procedure is  unlikely to affect the dysmenorrhea she experiences.  The patient has  received a copy of a Novasure brochure, her questions were answered, and she  wishes to proceed with her procedure. The patient is scheduled to undergo  endometrial ablation with the Novasure system on January 11, 2005 at 1 p.m.  at [facility] [place].     Elmi   EJP/MEDQ  D:  01/04/2005  T:  01/04/2005  Job:  [id number]

## 2011-05-18 NOTE — H&P (Signed)
NAMEAEDYN, [name]                       ACCOUNT NO.:  [id number]   MEDICAL RECORD NO.:  [id number]                   PATIENT TYPE:  MAT   LOCATION:  MATC                                 [facility]   PHYSICIAN:  Janine Limbo, M.D.            DATE OF BIRTH:  July 08, 1980   DATE OF ADMISSION:  11/07/2003  DATE OF DISCHARGE:                                HISTORY & PHYSICAL   [name] is a 31 year old, gravida 3, para 2-0-0-2, who presents at 55-  4/7 weeks, EDD December 14, 2003, as determined by 17-week ultrasound and  confirmed with followup.  She presents today with complaint of vaginal  bleeding earlier today and currently pinkish vaginal discharge with lower  abdominal and pelvic pain, sharp shooting pain in her perineal and rectal  area.  She does report positive fetal movement, no rupture of membranes,  irregular contractions that are no different from what they have usually  been.  She has no PIH symptoms, no headaches, visual changes, or epigastric  pain.  She was seen last Sunday, October 31, 2003, for nausea and vomiting  and decreased fetal movement with probable UTI.  She no longer has any  nausea or vomiting.  She has felt much better in that respect since  Sunday  and does have positive fetal movement.  She was medicated with Ancef 1 g IV  prior to discharge last Sunday.  She was also given a prescription for  Macrobid 1 p.o. b.i.d. for 7 days and states she has been taking that  medication and has only a couple of pills left.  Her urine culture on July  31 showed 65,000 colonies/ml multiple species, probable contamination.  She  states she has had no recent intercourse and in fact no intercourse since [facility]  and Chlamydia cultures were obtained and negative on October 18, 2003.  She  refuses vaginal cultures today.  Her pregnancy has been followed by the MD  service at  [facility] and is remarkable for 1) morbid  obesity.  The patient is  greater than 350 pounds.  2) History of  hyperemesis.  3) Asthma.  4) Late to care.  5) Abnormal LMP.  First menses  after previous delivery.  She is group B strep negative on October 31, 2003.   [name] presented for prenatal care on July 06, 2003, at 17 weeks and a  few days by ultrasound.  She has been seen throughout her pregnancy for  various complaints: Headache not relived with Tylenol and was taking  ibuprofen for that.  She signed consent for bilateral tubal sterilization.  She has been followed with twice weekly NST since 32 weeks and ultrasound  exam every 2 weeks.  On October 18, she complained of irregular  contractions.  Fetal fibronectin, GC, and Chlamydia were collected, and  these were all negative.  Her blood pressure has ranged from 120 to 160/60  to 72.  She has had no proteinuria in the office.   PRENATAL LABORATORY DATA:  On July 06, 2003, hemoglobin 9.5, hematocrit 31.5,  platelets 360,000.  Blood type and Rh A positive, antibody screen negative,  sickle cell trait negative, VDRL nonreactive, rubella immune, hepatitis B  surface antigen negative, HIV nonreactive.  Pap smear  within normal limits.  GC and Chlamydia negative at that time and again on October 18, 2003.  Fetal  fibronectin negative on October 18, 2003.  Group B strep negative on October 31, 2003, and wet prep negative today.   PAST OBSTETRICAL HISTORY:  In 2001, the patient had a normal spontaneous  vaginal delivery at 36 weeks with the birth of a 6 pound 3 ounce female  infant named Daivd Council. That pregnancy was significant for hyperemesis  throughout.  In 2004, the patient had a normal spontaneous vaginal delivery  at 42 weeks with the birth of a 7 pound 4 ounce female infant named Crystal.  She again had hyperemesis throughout her pregnancy.  This pregnancy is her  third pregnancy.   PAST MEDICAL HISTORY:  Significant for hyperemesis with previous pregnancy  and at the beginning of this  pregnancy.  She has a history of asthma and  seasonal allergies.  She is morbidly obese.  She has a history of  hypertension on no current medications.   PAST SURGICAL HISTORY:  Wisdom teeth.   FAMILY HISTORY:  There is a maternal grandmother and grandfather with a  history of MI, patient's mother with an enlarged heart, maternal aunt with  CHF.  Mother, maternal aunt and maternal uncle also have hypertension.  There is a maternal grandmother with breast cancer, maternal grandfather  with vocal cord cancer, maternal aunt with skin cancer, patient's mother  with history of depression and anxiety.   GENETIC HISTORY:  There is a maternal cousin with questionable history of  Down's syndrome and maternal cousin x 2 with sickle cell trait.   SOCIAL HISTORY:  [name] is a single, 31 year old African-American  female.  The father of the baby, Netty Starring, is involved and  supportive.  They follow a [employer] faith.  She denies the use of tobacco,  alcohol, or illicit drugs.   ALLERGIES:  No known drug allergies.   REVIEW OF SYSTEMS:  [name] is 34-4/7 weeks with complaint of vaginal  bleeding.  No preterm labor or signs of bleeding at the current time.   PHYSICAL EXAMINATION:  VITAL SIGNS:  Stable, afebrile.  Blood pressure  140/86, 148/73.  HEENT:  Unremarkable.  HEART:  Regular rate and rhythm.  LUNGS:  Clear.  ABDOMEN:  Baseline of fetal heart monitor is 120 to 130 with average long-  term variability, positive accelerations.  Fetal heart rate is reactive with  no periodic changes.  Contractions are difficult to trace and seem to be  mild and irregular.  PELVIC:  Sterile speculum exam finds no blood noted in the vaginal vault.  A  creamy white discharge is noted.  Cervical exam finds the cervix to be 1 cm  dilated.  It is long and thick with the presenting part high out of pelvis.  EXTREMITIES:  DTRs are 1+ with no clonus.  Extremities show no pathologic   edema.  ASSESSMENT:  1. Intrauterine pregnancy at 34-4/7 weeks.  2. Third trimester bleeding.   PLAN:  Admit for 23-hour observation per Dr. Marline Backbone.  Orders as  written with ultrasound to be complete in a.m.  to rule out abruption.     Rica Koyanagi, C.N.M.               Janine Limbo, M.D.    SDM/MEDQ  D:  11/07/2003  T:  11/07/2003  Job:  [id number]

## 2011-05-18 NOTE — Discharge Summary (Signed)
NAME:  [name], [name]                         ACCOUNT NO.:   MEDICAL RECORD NO.:  [id number]                    PATIENT TYPE:   LOCATION:                                       FACILITY:   PHYSICIAN:  Bradly Bienenstock, M.D.                   DATE OF BIRTH:   DATE OF ADMISSION:  07/11/2002  DATE OF DISCHARGE:  07/17/2002                                 DISCHARGE SUMMARY   DISCHARGE DIAGNOSES:  1. Intrauterine pregnancy at [redacted] weeks gestation.  2. Hyperemesis gravidarum difficult to control.  3. Iron deficiency anemia.  4. E. coli septicemia - thought secondary to line sepsis from PICC line.   DISCHARGE MEDICATIONS:  1. Thorazine 100 mg rectal suppository q.6h.  2. Prozac 20 mg two pills q.d.  3. Prenatal vitamins.   FOLLOW UP:  The patient was to follow up at the high risk clinic the week  after discharge and behavioral health.   CONSULTATION:  Hospital psychiatry consult was underlying psychiatric  disorder.   PRESENTING HISTORY:  This is a 31 year old G-2, P-0-1-0-1 who presented at  15 weeks 5 days gestation by a first trimester ultrasound with the chief  complaint of nausea and vomiting.  No contractions.  No vaginal bleeding.  Positive fetal activity.  Membranes intact.  The patient presented to [facility] with severe vomiting.  The patient had had a history of nausea and  vomiting since early of her pregnancy and had had multiple admissions,  hospitalized eight times prior to this presentation secondary to vomiting.  On presentation the patient had a PICC line that had been placed at her last  hospitalization for IV fluids and Phenergan.  Home health was helping with  her PICC line management.  She had previously tried Medrol doses to control  her hyperemesis without success.   MEDICATIONS AT HOME:  Continue IV fluids with Phenergan, albuterol and  Prozac.   ALLERGIES:  No known drug allergies.   SPECIFIC HISTORY:  The patient had been vomiting all through her  previous  pregnancy which was delivered at [redacted] weeks gestation.   MEDICAL HISTORY:  Significant for asthma and depression.   SURGICAL HISTORY:  None.   FAMILY AND SOCIAL HISTORY:  No smoking or alcohol.  No drugs.   PHYSICAL EXAMINATION ON PRESENTATION:  The patient was febrile to 1.3.  Pulse was 109. Respiratory rate was 22.  Blood pressure 135/72.  GENERAL:  The patient was repeatedly vomiting.  PULMONARY:  Clear bilaterally.  ABDOMEN:  Obese.  No point tenderness.  Speculum exam was deferred.   LABS ON PRESENTATION:  The patient did have white count of 8.7.   ASSESSMENT:  Assessment at that time was this was a patient with hyperemesis  gravidarum who had been multiple times for frequent vomiting.  It was felt  that she had a strange affect and there was a question possibility of  some  sort of psych overlay with her presentation and she was hospitalized.   HOSPITAL COURSE:  1. Nausea and vomiting. The patient was started on IV fluids plus Zofran 4     mg q.4h. IV. Thyroid studies were checked, and __________ was checked.     It was decided to pull the patient's PICC line out of concern for the     possibility of a line sepsis.  Blood cultures times two were drawn, and     the patient was started on vancomycin 1 gram IV q.12h.  The patient did     grow out E. coli from her blood and sensitivities were sensitive to     ceftriaxone, Cipro, levothyroxine, norfloxacin and tobramycin.  When     sensitivities were known the patient was switched to ceftriaxone IV.  The     patient improved somewhat after this but continued to have nausea and     vomiting.  Secondary to this psychiatry was consulted because she was     having an adjustment disorder and recommended outpatient psych follow up.     For the patient's protracted vomiting she did start her on Thorazine     suppositories which she seemed to tolerate well.  At the time of     discharge she had not been vomiting for greater than  48 hours and was     tolerating a normal diet.  The patient received a full six days of IV     antibiotics. The PICC line was removed so the patient could safely be     discharged without further antibiotic therapy.   LABS ON DISCHARGE:  TSH was 1.069.  Free T4 1.09.  Free T3 1.9.  Basic  metabolic profile was normal except for slightly low sodium at 133 and a BUN  slightly low at 3.  Albumin was somewhat low as well at 3.0.  Prealbumin was  also low at 10.0.   FOLLOW UP:  Follow up was as above.                                               Bradly Bienenstock, M.D.    [name]  D:  09/20/2002  T:  09/22/2002  Job:  [id number]

## 2011-05-18 NOTE — Discharge Summary (Signed)
[name], [name]                       ACCOUNT NO.:  [id number]   MEDICAL RECORD NO.:  [id number]                   PATIENT TYPE:   LOCATION:                                       [facility]   PHYSICIAN:  Conni Elliot, M.D.             DATE OF BIRTH:  [birth year]   DATE OF ADMISSION:  06/23/2002  DATE OF DISCHARGE:  06/26/2002                                 DISCHARGE SUMMARY   ADMISSION DIAGNOSES:  1. Hyperemesis.  2. Dehydration.  3. Pregnancy.  4. Iron deficiency anemia.   DISCHARGE DIAGNOSES:  1. Hyperemesis.  2. Dehydration.  3. Iron deficiency anemia.  4. Intrauterine pregnancy.   CONSULTS:  None.   PROCEDURE:  None.   ADMISSION HISTORY AND PHYSICAL:  The patient was a 31 year old G2, P0-1-1-1  at 12 weeks and 6 days who arrived via EMS with complaints of low abdominal  pain, brown discharge.  Has had multiple admissions to maternity admissions  unit, seven times since May 17, 2002.  The patient had multiple social  service resource needs including psychiatric needs.  Placed in [facility] an appointment on June 25, 2002.  She did have a history of nausea  and vomiting with that pregnancy and multiple admissions for same diagnosis.   MEDICATIONS:  1. Prozac.  2. Albuterol.  3. Prenatal vitamins.   PHYSICAL EXAMINATION:  VITAL SIGNS:  Temperature 99.5.  ABDOMEN:  Soft, vaguely tender in the lower quadrants.  PELVIC:  Cervical os was closed.  No cervical motion tenderness.  Adnexa was  nontender.  Scant white discharge from vagina.  Uterus was nontender.  Limited ability to palpate secondary to body habitus.   LABORATORIES:  GC and Chlamydia were negative x2 on May 17, 2002.  Wet prep  was within normal on May 17, 2002.  Ultrasound showed intrauterine pregnancy  with cardiac activity and fetal movement.  This was May 17, 2002 ultrasound.  Urinalysis showed specific gravity 1.015, greater than 80 ketones, negative  LE, negative protein,  negative glucose.  The patient was admitted for  _________, nausea, vomiting, hyperemesis.  Admitted for IV hydration.   HOSPITAL COURSE:  Problem 1 - HYPEREMESIS WITH DEHYDRATION:  The patient was  started on lactated Ringer's 1000 mL with Phenergan 25 mg 500 mL bolus, then  200 mL/hour for hydration.  She was advanced during hospitalization to D5 LR  150 mL/hour, clear liquids followed by regular breakfast which she tolerated  on 06/24/2002.  She was started on Reglan 10 mg p.o. daily and increased her  ability to take p.o.  Hydration status had improved voiding multiple times  on day of discharge.  Good appetite with no nausea and vomiting on day of  discharge and was eating solid foods without problems.  Problem 2 - IRON DEFICIENCY ANEMIA:  The patient known to have hemoglobin of  9.7, hematocrit 31.  On admission ferritin was low  at 6, percent saturation  low at 8, iron low at 22, total iron binding capacity 267 with a transferrin  of 214.  This improved to 10.3 during hospitalization.  She was discharged  on prenatal vitamins with iron, Reglan, and Pepcid.  The patient's condition  improved on June 26, 2002.  She is stable from OB standpoint and hydration  status for discharge to home.  Of note, she did have some social work  intervention during hospitalization.  She did have a history of depression.  Was continued on her Prozac.  She had an appointment made at Mental Health  on June 29, 2002.  Also had an appointment on July 3 at [facility].  Antenatal discharge instruction sheet was provided to patient including  instructions to take prenatal vitamins, Reglan, and Pepcid as had been  directed.  Follow up as previously dictated.  Discharge teaching and  instructions given.  The patient verbalized understanding of instructions.  Discharged in stable condition to home on June 26, 2002.     Silas [place], M.D.                         Conni Elliot, M.D.    Jearld Pies  D:   02/18/2003  T:  02/18/2003  Job:  [id number]

## 2011-05-18 NOTE — H&P (Signed)
[facility]  Patient:    [name], [name] Visit Number: [id number] MRN: [id number]          Service Type: OBS Location: 9300 [id number] Attending Physician:  Jaymes Graff A Dictated by:   Nigel Bridgeman, C.N.M. Admit Date:  05/21/2002                           History and Physical  HISTORY OF PRESENT ILLNESS:   Ms. Catlin is a 31 year old gravida 2, para 1-0-0-1, at [redacted] weeks gestation, who presents for a direct admission for a 23-hour observation secondary to recurrent hyperemesis.  The patient has been seen May 16, May 19, May 21, and now May 22, for first trimester nausea and vomiting.  She reports no food and fluid intake for two days.  She has a history of recurrent and severe hyperemesis the last pregnancy.  Currently she has been treated with Phenergan, Reglan, Medrol taper and Zofran which she began on May 22, without benefit.  She has had an ultrasound at seven weeks which showed a single intrauterine pregnancy.  The pregnancy has been remarkable for (1) a history of proteinuria last pregnancy, present again, (2) obesity, (3) hyperemesis, (4) history of asthma.  LABORATORY TODAY:             A clean-catch UA shows a specific gravity 1.020, greater than 80 ketones, bilirubin was small amount, 100 mg of protein, and a trace leukocyte esterase; this was on a voided specimen.  The patients blood type is A positive.  PHYSICAL EXAMINATION:  HEENT:                        Within normal limits.  LUNGS:                        Bilateral breath sounds are clear.  heart rate                    Regular rate and rhythm without murmur.  BREASTS:                      Soft and nontender.  ABDOMEN:                      Obese with an inability to delineate the uterine dimensions.  Fetal heart tones are not able to be auscultated secondary to adipose tissue.  EXTREMITIES:                  Deep tendon reflexes are 2+ without clonus. There is a trace  edema noted.  PELVIC EXAM:                  Deferred.  IMPRESSION: 1. Intrauterine pregnancy at nine weeks. 2. Hyperemesis, refractory to current modes of treatment.  PLAN: 1. Admit to the third floor for a 23-hour observation per Dr. Normand Sloop. 2. IV hydration with the first bag of LR and Phenergan and the second bag of    D-5 LR with multivitamin, third bag plus of LR. 3. Present n.p.o. 4. Need to reevaluate for long-term plan regarding hyperemesis and    proteinuria. 5. The MDs will follow. Dictated by:   Nigel Bridgeman, C.N.M. Attending Physician:  Michael Litter DD:  05/22/02 TD:  05/22/02 Job: [id number]

## 2011-05-18 NOTE — Discharge Summary (Signed)
[name], [name]             ACCOUNT NO.:  [id number]   MEDICAL RECORD NO.:  [id number]          PATIENT TYPE:  INP   LOCATION:  9319                          [facility]ILITY:  [facility]   PHYSICIAN:  Hal Morales, M.D.DATE OF BIRTH:  Mar 23, 1980   DATE OF ADMISSION:  07/24/2005  DATE OF DISCHARGE:  07/25/2005                                 DISCHARGE SUMMARY   DISCHARGE DIAGNOSIS:  Menorrhagia, severe dysmenorrhea, fibroid uterus,  status post NovaSure endometrial ablation.   OPERATION:  On the day of admission the patient underwent a total vaginal  hysterectomy and tolerated procedure well. The patient was found to have an  upper limits of normal size uterus weighing 105 grams. The ovaries appeared  normal bilaterally and the tubes were status post interruption for tubal  sterilization.   HISTORY OF PRESENT ILLNESS:  Ms. [name] is a 31 year old single African-  American female who is status post bilateral tubal ligation, para 3-0-0-3,  who presents for hysterectomy because of refractory menorrhagia and anemia.  Please see the patient's dictated history and physical examination for  details.   PREOPERATIVE PHYSICAL EXAM:  VITAL SIGNS:  Blood pressure 130/82, weight is  over 350 pounds, height is 5 feet 5 inches tall.  GENERAL:  Exam is within normal limits.  PELVIC:  EGBUS is within normal limits. Vagina is rugose. Cervix is  nontender without lesions. Uterus appears normal size, shape and consistency  without tenderness, however examination is limited by body habitus. Adnexa  without tenderness or masses. Rectovaginal examination was without  tenderness or masses.   HOSPITAL COURSE:  On the day of admission the patient underwent  aforementioned procedures tolerating it well. Postoperative course was  unremarkable with the patient tolerating a postop hemoglobin of 10.1 (preop  hemoglobin 11.6). By postop day #1 the patient had resumed bowel and bladder  function and was  therefore deemed ready for discharge home.   DISCHARGE MEDICATIONS:  1.  Tylox 1-2 tablets every 4-6 hours as needed for pain.  2.  Ibuprofen 600 mg 1 tablet with food every 6 hours for 3 days then as      needed for pain.  3.  Colace 100 mg 1 tablet twice daily until bowel movements are regular.  4.  Phenergan 25 mg 1 tablet every 6 hours as needed for nausea  5.  Ferrous sulfate 325 mg 1 tablet twice daily for 6 weeks.   FOLLOW-UP:  The patient is to call [facility] for a 6 weeks  postoperative visit with Dr. Pennie Rushing.   DISCHARGE INSTRUCTIONS:  The patient was given a copy of [facility] postoperative instruction sheet. She was further advised to avoid  driving for 2 weeks, heavy lifting for 4 weeks and intercourse for 6 weeks.  She was given permission to walk up steps to shower and to bathe. The  patient's diet was without restriction.   FINAL PATHOLOGY:  Uterus and cervix: Chronic cervicitis with squamous  metaplasia and no intraepithelial lesion identified. The patient endometrium  revealed secretory endometrium with findings consistent with previous  biopsy. No hyperplasia or malignancy  identified. Myometrium:  Leiomyoma.      [name].      Hal Morales, M.D.  Electronically Signed    EJP/MEDQ  D:  08/22/2005  T:  08/23/2005  Job:  [id number]

## 2011-05-18 NOTE — Discharge Summary (Signed)
[facility]  Patient:    [name], [name]                    MRN: [id number] Adm. Date:  [id number] Disc. Date: [id number] Attending:  Antionette Char Dictator:   Jewel Baize, M.D.                           Discharge Summary  DISCHARGE DIAGNOSES:          1. Hyperemesis gravidarum.                               2. Intrauterine pregnancy.                               3. Dehydration.                               4. Hypokalemia.  HOSPITAL COURSE:              The patient is a 31 year old African-American female who was admitted to the hospital after a prolonged history of hyperemesis and dehydration.  The patient was at home receiving IV therapies, became dehydrated, and decreased potassium.  She was admitted to the hospital after failing outpatient Phenergan management.  She had not taken her medications, Unisom and B6 which she had been prescribed and now was not being able to tolerate any p.o. intake. The patient was admitted and placed on IV fluids, replaced potassium.  She was started on IV steroid taper as well as B6, Unisom, and Phenergan, and Reglan.  The patient gradually improved, dehydration resolved.  She was then able to tolerate p.o. fluids and then p.o. solid foods.  At the time of discharge, her IV medications had been switched to p.o. and she was tolerating meals well, although, somewhat nervous about going home.  DISPOSITION:                  Discharged home.  DISCHARGE MEDICATIONS:        1. Medrol 4 mg #39 taper as provided on calendar er                                  protocol.                               2. B6 25 mg p.o. b.i.d.                               3. Unisom one tablet p.o. q.d.                               4. Reglan 10 mg p.o. q.a.c. and h.s.                               5. Phenergan p.r.n.  She is to push p.o. fluids and return to as much of a normal diet as possible.  FOLLOW-UP:  Follow-up appointment was scheduled for her for early next week in the [facility].  She is to notify the doctor of any change in her condition including return of vomiting or nausea.  She is to keep daily weights. DD:  03/01/00 TD:  03/04/00 Job: [id number]

## 2011-05-18 NOTE — Discharge Summary (Signed)
   NAMEKIMELA, [name]                       ACCOUNT NO.:  [id number]   MEDICAL RECORD NO.:  [id number]                   PATIENT TYPE:  INP   LOCATION:  9133                                 [facility]ILITY:  [facility]   PHYSICIAN:  Dianah Field, M.D.                 DATE OF BIRTH:  1980-06-19   DATE OF ADMISSION:  08/03/2002  DATE OF DISCHARGE:  08/07/2002                                 DISCHARGE SUMMARY   ADDENDUM:  This is an addendum to a previously dictated discharge summary.   The patient's discharge was held secondary to episodes of nausea and  vomiting on the night prior to discharge.  The patient was placed on IV  fluids for rehydration and monitored overnight.  The patient was able to  tolerate a p.o. intake with no repeat episodes of vomiting.  She is  currently stable for discharge home, and will be discharged home with  outpatient followup as previously discussed.  The patient did complain of  loose stools, however, no stool was able to be obtained for fetal white  blood cell count secondary to the patient never having loose stools during  the remainder of her hospitalization.                                               Dianah Field, M.D.    SG/MEDQ  D:  08/07/2002  T:  08/08/2002  Job:  [id number]   cc:   [facility]

## 2011-05-18 NOTE — Consult Note (Signed)
[name], [name]                       ACCOUNT NO.:  [id number]   MEDICAL RECORD NO.:  [id number]                   PATIENT TYPE:  INP   LOCATION:  9324                                 [facility]ILITY[facility]  [facility]   PHYSICIAN:  Florencia Reasons, M.D.             DATE OF BIRTH:  [birth year]   DATE OF CONSULTATION:  08/21/2002  DATE OF DISCHARGE:                           GASTROENTEROLOGY CONSULTATION   REASON FOR CONSULTATION:  The gynecology teaching service asked [place] to see  this 31 year old African-American female who is [redacted] weeks pregnant because of  apparent hyperemesis gravidarum in the setting of some hemoccult positive  emesis recently.   The patient had significant hyperemesis throughout her first pregnancy two  years ago, at which time an ultrasound showed sludge in the gallbladder.   At baseline, however, when not pregnant, she has no GI tract symptomatology;  specifically, no dyspepsia, nausea, vomiting, or abdominal pain,  constipation, or diarrhea.   The current problem with nausea and vomiting began around the time her  pregnancy was first diagnosed.  It has continued to be a problem throughout  the pregnancy and has prompted a total of approximately 10 admissions.  In  fact, she was just released from the hospital recently prior to the current  admission.   The patient does not have ulcer risk factors such as smoking or aspirin or  NSAID exposure, or any family history of ulcer disease or prior personal  history of ulcer disease.  She does not have any significant abdominal pain  to suggest biliary tract disease.   She may go as many as four or five days without vomiting before the symptoms  start up again.  The symptoms of vomiting occur even in the absence of food  and have even bothered her while asleep at night.   PAST MEDICAL HISTORY:  Negative for hypertension, diabetes, ulcers, or any  significant chronic medical illnesses.  As noted, has prior history of  hyperemesis with her previous pregnancy.   The patient most recently diagnosed with a major depression in the early  resolving phase, by Dr. Rico Junker.   No abdominal surgery.   FAMILY HISTORY:  Negative for gallstones, colon cancer, ulcers, or liver  disease.   SOCIAL HISTORY:  The patient is unmarried but is engaged and apparently she  and her fiance are looking favorably upon this pregnancy.   REVIEW OF SYSTEMS:  Please see HPI.   PHYSICAL EXAMINATION:  GENERAL:  This is a substantially obese African-  American female in absolutely no distress.  She is anicteric and without  frank pallor.  CHEST:  Clear.  HEART:  Without gallops, rubs, murmurs, clicks, or arrhythmias.  ABDOMEN:  Substantially obese with normal bowel sounds.  No significant  tympani, no bruits.  Normal liver span by a scratch test.  No guarding,  mass, or tenderness.  RECTAL:  Exam by the resident showed an empty ampulla with a  small amount of  brown hemoccult negative stool.   LABORATORY DATA:  Pertinent for microcytic anemia with a current hemoglobin  of 9.0, MCV 66, RDW 20.   IMPRESSION:  1. Recurrent nausea and vomiting, most likely due to hyperemesis gravidarum.  2. History of hemoccult positive emesis with heme negative stool from below     and a fairly stable, albeit low, hemoglobin.  3. Microcytic anemia suggestive of chronic iron deficiency anemia.  4. Major depression by psychiatrist's evaluation.   DISCUSSION AND RECOMMENDATIONS:  I concur with the clinical impression of  hyperemesis gravidarum.  I find no evidence on history of physical exam to  suggest biliary tract disease, peptic ulcer disease, pancreatitis, or other  sources of the patient's symptoms.  Note that liver chemistries have been  repeatedly normal when checked.   I would not favor further GI workup at this time.  I would favor continued  supportive care as you are doing for hyperemesis and I would also favor  empiric  antipeptic therapy with what ever agent the obstetrician prefers,  such as an H2 blocker or a proton pump inhibitor, to help cover our bases  with respect to any peptic disease which might be going on (doubt).  I would  continue this therapy until the patient's vomiting resolves which, based on  her prior pregnancy, might not occur until parturition.   Please let me know if I can be of further assistance in this patient's care.                                               Florencia Reasons, M.D.    RVB/MEDQ  D:  08/21/2002  T:  08/22/2002  Job:  [id number]   cc:   Enid Cutter, M.D.

## 2011-05-18 NOTE — H&P (Signed)
[facility]  Patient:    [name], [name]                    MRN: [id number] Adm. Date:  [id number] Disc. Date: [id number] Attending:  Shaune Spittle Dictator:   Nigel Bridgeman, C.N.M.                         History and Physical  HISTORY OF PRESENT ILLNESS:       [name] is a 31 year old, gravida 1, para 0 at 36-6/7 weeks who presents with uterine contractions every 5 to 10 minutes throughout the evening and bloody show and vomiting.  She denied any leaking and reported positive fetal movement.  She has had multiple evaluations over the last two weeks in maternity admission for cramping and vomiting.  She was seen August 4 for back pain and vomiting.  She had ketones and 100 mg protein on a catheterized specimen.  Urine was sent for culture which showed no growth.  She was given a dose of Ancef and placed on Macrobid prior to the return of the urine culture.  She denies any headache, visual symptoms, or epigastric pain.  Her pregnancy had been remarkable for 1) Hyperemesis, 2) obesity, 3) asthma, 4) recent proteinuria without signs or symptoms of preeclampsia.  PRENATAL LABORATORY DATA:         Will be dictated fully separately.  HISTORY OF PRESENT PREGNANCY:     The patient entered care at approximately 8 weeks.  She had significant hyperemesis in the first part of her pregnancy which required hospitalization and IV fluids.  She was followed with ultrasound at 23 weeks which showed growth two weeks ahead of prior ultrasound.  She had another ultrasound at approximately 28 weeks.  She had a normal glucola at 19 weeks and a normal one at 30 weeks.  Her pregnancy was characterized by significant musculoskeletal discomfort, sporadic recurrence of vomiting, and generalized discomfort.  The [name] of August 31, 2000, was established by ultrasound in the first trimester.  Group B strep culture was negative at 36 weeks.  Hepatitis was negative.   Rubella was positive.  VDRL was nonreactive.  Hemoglobin was 11.2 upon entering the practice.  She declined AFP.  OBSTETRICAL HISTORY:              The patient is a primigravida.  PAST MEDICAL HISTORY:             She was on oral contraceptives until approximately two and one-half hours ago.  She reports the usual childhood illnesses.  She had elevated blood pressure at one time in her life but has had no problems in the latter part of her pregnancy.  She did have one episode of elevated blood pressure early in pregnancy.  She has a history of asthma for which she uses inhalers which is seasonally induced.  She had wisdom teeth removed at age 51.  She was hospitalized several times this pregnancy for nausea and vomiting.  ALLERGIES:                        No known drug allergies.  FAMILY HISTORY:                   Her maternal great aunt had twins; one died during delivery.  Her mother had pregnancy-induced hypertension.  Her maternal grandmother had heart attack.  Her maternal grandfather  had heart attack.  Her mother had enlarged heart.  Her maternal aunt had congestive heart failure. Her mother has hypertension and is on medication.  Her maternal aunt x 2 and her maternal uncle have hypertension.  Her father has poor veins and poor circulation.  Her mother has varicose veins.  Her sister has varicose veins. Her mother has anemia after surgery.  She has a cousin with questionable lupus or rheumatoid arthritis.  Her maternal grandmother had breast cancer.  Her maternal grandfather had vocal cord cancer.  Her maternal aunt has skin cancer.  Her mother has used medications for depression and anxiety in the past.  Her maternal great aunt has anxiety disorder and uses medication for "her nerves."  Her maternal aunt also has anxiety disorder.  Her maternal cousin has questionable Downs syndrome.  Her maternal cousins x 2 have sickle cell disease.  SOCIAL HISTORY:                   The  patient is single.  The father of the baby is involved and supportive.  His name is [name].  The patient is supported by her mother who is a [facility].  The patient is completing high school.  Her partner also has completed high school and is in Airline [employer].  She has been followed by the physician service at [facility]. She denies any alcohol, drug, or tobacco use during this pregnancy.  PHYSICAL EXAMINATION:  VITAL SIGNS:                      Stable.  Blood pressure 125/53, respirations 20, pulse 122, temperature 98.4.  HEENT:                            Within normal limits.  LUNGS:                            Bilateral breath sounds are clear.  HEART:                            Regular rate and rhythm without murmur.  BREASTS:                          Soft and nontender.  ABDOMEN:                          Fundal height is approximately 39 cm. Estimated fetal weight 8 to 8-1/2 pounds.  Uterine contractions are every 5 to 8 minutes, mild to moderate quality.  PELVIC:                           Cervical exam 4 cm, 70 to 80%, vertex, and a -2 stating with slightly bulging bag of water.  There is positive bloody show noted.  EXTREMITIES:                      Deep tendon reflexes are 2+ without clonus. There is a trace edema noted.  LABORATORY DATA:                  Clean catch urine shows specific gravity of 1.032, greater than 80 ketones, 100 mg protein, small bilirubin.  Fetal heart  rate is reactive with no decelerations.  IMPRESSION:                       1. Intrauterine pregnancy at 36-6/7 weeks.                                   2. Early labor.                                   3. Proteinuria with no evidence of                                      preeclampsia.                                   4. Dehydration.  PLAN:                             1. Admit to birthing suite for consult with                                      Dr. Marline Backbone  as attending                                      physician.                                   2. Routine physician orders.                                    3. Will plan IV hydration and pain                                      medication p.r.n.                                   4. Dr. Stefano Gaul will follow.          DD: 08/10/00 TD:  08/10/00 Job: [id number]

## 2011-05-18 NOTE — Discharge Summary (Signed)
[facility]  Patient:    [name], [name] Visit Number: [id number] MRN: [id number]          Service Type: MED Location: [id number] Attending Physician:  Jaymes Graff A Dictated by:   Saverio Danker, C.N.M. Admit Date:  05/21/2002 Discharge Date: 05/22/2002                             Discharge Summary  ADMISSION DIAGNOSES:          1. Intrauterine pregnancy at approximately                                  nine weeks gestation.                               2. Hyperemesis.  DISCHARGE DIAGNOSES:          1. Intrauterine pregnancy at approximately                                  nine week gestation.                               2. Hyperemesis, improved.  OPERATION/PROCEDURE:          None.  HOSPITAL COURSE:              [name] is a 31 year old single black female, gravida 2 para 1, 0-0-1, at nine weeks by early ultrasound, who presents for evaluation secondary to persistent nausea and vomiting.  She had been seen on May 15, 2002, May 18, 2002, and May 20, 2002, and now on May 21, 2002 for first trimester nausea and vomiting, and has been on Phenergan, Reglan, and most recently Medrol Dospak, and started Zofran on May 21, 2002; all with "no benefit to the patient."  She has reported that she has been unable to keep any kind of food down for the last week.  She has not completely complied with recommendations of clear liquids when she has been at home.  Her pregnancy has also been complicated by history of proteinuria which has persisted and which is present again, obesity, and a history of hyperemesis with her last pregnancy, and has a history of asthma.  She was given IV fluids on this hospital admission with 25 mg of Phenergan in the first bag, D-5 LR with multivitamins in the second bag, and the third bag has been just plain LR, and she has tolerated clear liquids throughout her hospital stay and has now tolerated a BRAT diet  since this morning.  She is deemed ready for discharge now that she is tolerating an increased diet and is keeping all her liquids down without any medication.  PLAN:                         1. The plan is to discharge her home with a                                  Medrol taper.  2. Follow up in the office as needed.  DISCHARGE MEDICATIONS:        1. Medrol taper per the [facility]                                  Medrol dosing schedule.  The patient has a                                  prescription with a copy of that schedule.                               2. Prenatal vitamins only as tolerated at this                                  point.  DISCHARGE LABORATORY DATA:    Her urine had greater than 80 ketones on admission, 2+ protein on admission, many bacteria, trace leukocyte esterase, and few epithelials.  No other laboratories were done on this admission.  FOLLOW-UP:                    Follow-up will be as needed or in two weeks at [facility]. Dictated by:   Vance Gather Duplantis, C.N.M. Attending Physician:  Michael Litter DD:  05/22/02 TD:  05/26/02 Job: [id number]

## 2011-05-18 NOTE — H&P (Signed)
[facility]  Patient:    [name], [name]                      MRN: [id number] Adm. Date:  08/19/00 Attending:  Georgina Peer, M.D. Dictator:   Nigel Bridgeman, C.N.M.                         History and Physical  DATE OF BIRTH:                27-Mar-1980  HISTORY OF PRESENT ILLNESS:   [name] is a 31 year old, gravida 1, para 0 at 75 weeks who presents with the onset of leaking at approximately 5:30 p.m. tonight with clear fluid noted and sporadic contractions. The pregnancy has been remarkable for: 1) Obesity, 2) asthma, 3) hyperemesis. The patients had a number of admissions to [facility] for hyperemesis in the early part of her pregnancy and a number of observations for questionable labor and various complaints over the last several months.  PRENATAL LABS:                Blood type is A+. Rh antibody negative. VDRL nonreactive. Rubella titer positive. Hepatitis B surface antigen negative. HIV declined. Sickle cell test negative. Urine culture the first trimester was negative. Glucose challenge was normal. AFP was declined. Hemoglobin upon entering the practice was 11.2. It was within normal limits at 28 weeks. Group B strep culture was negative at 36 weeks. EDC of August 31, 2000 was established by ultrasound at approximately 6 weeks.  HISTORY OF PRESENT PREGNANCY:  The patient entered care at approximately 18 weeks. She had been taking care of through [facility] and the teaching service up until that time. She was treated several times for hyperemesis and intermittent hypertensive episodes. She had been placed on Bactrim in the second trimester. She had an early glucola that was normal. She had an ultrasound at 23 weeks that showed ultrasound 2 weeks ahead of prior ultrasound. She had normal growth at 32 to 34 weeks. Her pregnancy was remarkable for multiple evaluations for back pain and uterine contractions.  OBSTETRICAL  HISTORY:          The patient has a primigravida medical history. She was on contraceptives until approximately 2.5 years ago. She reports the usual childhood illnesses. She had a history of chronic hypertension at times but no medications. She has a history of asthma for which she uses inhalers and it is seasonally induced. She had her wisdom teeth removed at age 82. She had sporadic crying spells following removal of her wisdom teeth and anesthesia. She was seen in this hospital several times this pregnancy for nausea and vomiting.  ALLERGIES:                    No known drug allergies.  FAMILY HISTORY:               Her mother had PIH. Her maternal grandmother had a heart attack. Her paternal grandfather had a heart attack. Mother had an enlarged heart. Maternal aunt had congestive heart failure. Mother has hypertension and is on medication. Maternal aunt and maternal uncles all have hypertension. Her father has poor veins and poor circulation. Her mother has varicose veins. Her sister has varicose veins. Her mother was anemic after surgery. Her maternal cousin has questionable lupus and rheumatoid arthritis. Her maternal grandmother had breast cancer. Her maternal  grandfather had vocal cord cancer. Her maternal aunt had skin cancer. Her mother has been on medications for depression and anxiety. Her maternal aunt had anxiety and was on nerve medication. Her maternal aunt was also on medication for anxiety and maternal great aunt as well.  GENETIC AUNT:                 Maternal cousin had questionable Down Synthroid, maternal cousin x 2 had sickle cell disease.  SOCIAL HISTORY:               The patient is single, father of the baby is involved and supportive, his name is Leonel Ramsay. The patient is African-American and of the [employer] faith. She has been followed by the physician service at [facility]. She denies any alcohol, drug or tobacco use during  this pregnancy. She has completed high school. Her partner is in Airline pilot.  PHYSICAL EXAMINATION:  VITAL SIGNS:                  Stable. The patient is afebrile.  HEENT:                        Within normal limits.  LUNGS:                        Bilateral breath sounds are clear.  HEART:                        Regular rate and rhythm without murmur.  BREASTS:                      Soft and nontender.  ABDOMEN:                      Fundal height is approximately 39 cm. Estimated fetal weight is 7 to 7.5 pounds. Uterine contractions are irregular and mild. Sterile speculum exam reveals positive fern, positive pooling, positive nitrazine.  PELVIC:                       Cervical exam is 3, 80% vertex with -1 station with a full bag felt. She is noted to be leaking copious amounts of clear fluid. Fetal heart rate is reassuring with no decelerations. Fetal heart rate is somewhat difficult to trace secondary to obesity. She is having irregular uterine contractions.  EXTREMITIES:                  Deep tendon reflexes are 2+ without clonus. There is a 1+ edema noted in the lower extremities.   IMPRESSION:                    1. Intrauterine pregnancy at 38 weeks.                                2. Ruptured membranes with no labor.                                3. Negative group B strep.                                4. Obesity.  5. Asthma.  PLAN:                          1. Admit to birthing suite with consult with                                   Dr. Trudie Reed, attending physician.                                2. Routine physician orders.                                3. The patient may ambulate at present or rest.                                4. If no labor by 6 a.m. spontaneously, Pitocin                                   augmentation will be begun.                                5. M.D. will follow. DD:  08/19/00 TD:  08/19/00 Job:  [id number]

## 2011-05-18 NOTE — Discharge Summary (Signed)
[facility]  Patient:    [name], [name]                    MRN: [id number] Adm. Date:  [id number] Disc. Date: 03/21/00 Attending:  Shaune Spittle Dictator:   Vance Gather Duplantis, C.N.M.                           Discharge Summary  ADMISSION DIAGNOSES:          1. Intrauterine pregnancy at 16 weeks.                               2. Hyperemesis with third admission.  DISCHARGE DIAGNOSES:          1. Intrauterine pregnancy at 16 weeks.                               2. Hyperemesis with third admission, improved.                               3. Gallbladder ultrasound revealing sludge in the                                  gallbladder.  PROCEDURE:                    None.  HOSPITAL COURSE:              [name] is a 31 year old, gravida 1, para 0, t [redacted] weeks gestation who is admitted for IV hydration secondary to nausea and vomiting all day after eating some fried chicken prior to her admission.  She has been admitted by the teaching service in the past with this pregnancy two times and has received and completed the Medrol therapy.  Her urinalysis in the office on  admission showed large ketones and was sent for culture secondary to its positive leukocyte esterase.  With this hospitalization, she is improved with IV therapy. She has continued to tolerate clear liquids throughout her hospitalization and er IV infiltrated on March 20, 2000, and was discontinued and she has subsequently  tolerated clear liquids without difficulty.  She is receiving Phenergan suppositories every six hours and finding that she is tolerating clear liquids ith this methodology.  She is deemed ready for discharge today.  DISCHARGE MEDICATIONS:        1. Phenergan 26 mg p.o. or p.r. q.6h. p.r.n. for                                  nausea and vomiting.  FOLLOW-UP:                    Next week for her new OB examination or subsequent visit at the  office.  DISCHARGE INSTRUCTIONS:       She is to be on a bland, no fat diet, and to advance it as tolerated.  DISCHARGE LABORATORY DATA:    None. DD:  03/21/00 TD:  03/21/00 Job: [id number]

## 2013-10-22 ENCOUNTER — Encounter (HOSPITAL_COMMUNITY): Payer: Self-pay | Admitting: Emergency Medicine

## 2013-10-22 ENCOUNTER — Emergency Department (HOSPITAL_COMMUNITY): Payer: Self-pay

## 2013-10-22 ENCOUNTER — Emergency Department (HOSPITAL_COMMUNITY)
Admission: EM | Admit: 2013-10-22 | Discharge: 2013-10-22 | Disposition: A | Discharge status: Home / Self Care | Payer: Self-pay | Attending: Emergency Medicine | Admitting: Emergency Medicine

## 2013-10-22 DIAGNOSIS — Z90710 Acquired absence of both cervix and uterus: Secondary | ICD-10-CM | POA: Insufficient documentation

## 2013-10-22 DIAGNOSIS — E669 Obesity, unspecified: Secondary | ICD-10-CM | POA: Insufficient documentation

## 2013-10-22 DIAGNOSIS — R112 Nausea with vomiting, unspecified: Secondary | ICD-10-CM | POA: Insufficient documentation

## 2013-10-22 DIAGNOSIS — F17200 Nicotine dependence, unspecified, uncomplicated: Secondary | ICD-10-CM | POA: Insufficient documentation

## 2013-10-22 DIAGNOSIS — R109 Unspecified abdominal pain: Secondary | ICD-10-CM

## 2013-10-22 DIAGNOSIS — K219 Gastro-esophageal reflux disease without esophagitis: Secondary | ICD-10-CM | POA: Insufficient documentation

## 2013-10-22 HISTORY — DX: Obesity, unspecified: E66.9

## 2013-10-22 LAB — URINALYSIS, ROUTINE W REFLEX MICROSCOPIC
Hgb urine dipstick: NEGATIVE
Leukocytes, UA: NEGATIVE
Nitrite: NEGATIVE
Protein, ur: NEGATIVE mg/dL
Specific Gravity, Urine: 1.025 (ref 1.005–1.030)
Urobilinogen, UA: 0.2 mg/dL (ref 0.0–1.0)

## 2013-10-22 LAB — CBC WITH DIFFERENTIAL/PLATELET
Basophils Absolute: 0 10*3/uL (ref 0.0–0.1)
Basophils Relative: 0 % (ref 0–1)
Eosinophils Absolute: 0 10*3/uL (ref 0.0–0.7)
Hemoglobin: 14.3 g/dL (ref 12.0–15.0)
Lymphocytes Relative: 20 % (ref 12–46)
Lymphs Abs: 1.4 10*3/uL (ref 0.7–4.0)
MCH: 29.7 pg (ref 26.0–34.0)
Monocytes Absolute: 0.4 10*3/uL (ref 0.1–1.0)
Monocytes Relative: 6 % (ref 3–12)
Neutro Abs: 5.4 10*3/uL (ref 1.7–7.7)
Neutrophils Relative %: 74 % (ref 43–77)
RBC: 4.81 MIL/uL (ref 3.87–5.11)

## 2013-10-22 LAB — LIPASE, BLOOD: Lipase: 15 U/L (ref 11–59)

## 2013-10-22 LAB — COMPREHENSIVE METABOLIC PANEL
ALT: 11 U/L (ref 0–35)
Alkaline Phosphatase: 62 U/L (ref 39–117)
GFR calc non Af Amer: >90 mL/min (ref >90)
Glucose, Bld: 100 mg/dL — ABNORMAL HIGH (ref 70–99)
Potassium: 3.5 mEq/L (ref 3.5–5.1)

## 2013-10-22 MED ORDER — OXYCODONE-ACETAMINOPHEN 5-325 MG PO TABS: 2.0000 | ORAL_TABLET | ORAL | Status: DC | PRN

## 2013-10-22 MED ORDER — SUCRALFATE 1 G PO TABS: 1.0000 g | ORAL_TABLET | Freq: Four times a day (QID) | ORAL | Status: DC

## 2013-10-22 MED ORDER — ONDANSETRON HCL 4 MG/2ML IJ SOLN
4.0000 mg | Freq: Once | INTRAMUSCULAR | Status: AC
  Administered 2013-10-22: 4 mg via INTRAVENOUS
  Filled 2013-10-22: qty 2

## 2013-10-22 MED ORDER — HYDROMORPHONE HCL PF 1 MG/ML IJ SOLN
1.0000 mg | Freq: Once | INTRAMUSCULAR | Status: AC
  Administered 2013-10-22: 1 mg via INTRAVENOUS
  Filled 2013-10-22: qty 1

## 2013-10-22 NOTE — ED Notes (Signed)
Bed: WA04 Expected date:  Expected time:  Means of arrival:  Comments: EMS-abdominal pain 

## 2013-10-22 NOTE — ED Provider Notes (Signed)
CSN: [id number]     Arrival date & time 10/22/13  1606 History   First MD Initiated Contact with Patient 10/22/13 1630     Chief Complaint  Patient presents with  . Abdominal Pain  . Nausea   (Consider location/radiation/quality/duration/timing/severity/associated sxs/prior Treatment) Patient is a 33 y.o. female presenting with abdominal pain. The history is provided by the patient.  Abdominal Pain  patient here complaining of epigastric and right upper quadrant pain after eating a hot dog today. Patient notes nausea and vomiting times one. Pain was characterized as sharp and radiating to her back. No fever or chills. No diarrhea or bloody stools. No prior history of same. Patient used Tylenol without relief. Denies any urinary symptoms. Denies any vaginal bleeding or discharge.  Past Medical History  Diagnosis Date  . Obesity    Past Surgical History  Procedure Laterality Date  . Abdominal hysterectomy     No family history on file. History  Substance Use Topics  . Smoking status: Current Every Day Smoker -- 0.50 packs/day    Types: Cigarettes  . Smokeless tobacco: Never Used  . Alcohol Use: No   OB History   Grav Para Term Preterm Abortions TAB SAB Ect Mult Living                 Review of Systems  Gastrointestinal: Positive for abdominal pain.  All other systems reviewed and are negative.    Allergies  Review of patient's allergies indicates no known allergies.  Home Medications   Current Outpatient Rx  Name  Route  Sig  Dispense  Refill  . acetaminophen (TYLENOL) 500 MG tablet   Oral   Take 1,000 mg by mouth every 6 (six) hours as needed for pain.          BP 147/98  Pulse 80  Temp(Src) 98.7 F (37.1 C) (Oral)  Resp 18  SpO2 100% Physical Exam  Nursing note and vitals reviewed. Constitutional: She is oriented to person, place, and time. She appears well-developed and well-nourished.  Non-toxic appearance. No distress.  HENT:  Head: Normocephalic  and atraumatic.  Eyes: Conjunctivae, EOM and lids are normal. Pupils are equal, round, and reactive to light.  Neck: Normal range of motion. Neck supple. No tracheal deviation present. No mass present.  Cardiovascular: Normal rate, regular rhythm and normal heart sounds.  Exam reveals no gallop.   No murmur heard. Pulmonary/Chest: Effort normal and breath sounds normal. No stridor. No respiratory distress. She has no decreased breath sounds. She has no wheezes. She has no rhonchi. She has no rales.  Abdominal: Soft. Normal appearance and bowel sounds are normal. She exhibits no distension. There is tenderness in the epigastric area. There is no rigidity, no rebound, no guarding and no CVA tenderness.    Musculoskeletal: Normal range of motion. She exhibits no edema and no tenderness.  Neurological: She is alert and oriented to person, place, and time. She has normal strength. No cranial nerve deficit or sensory deficit. GCS eye subscore is 4. GCS verbal subscore is 5. GCS motor subscore is 6.  Skin: Skin is warm and dry. No abrasion and no rash noted.  Psychiatric: She has a normal mood and affect. Her speech is normal and behavior is normal.    ED Course  Procedures (including critical care time) Labs Review Labs Reviewed  CBC WITH DIFFERENTIAL  COMPREHENSIVE METABOLIC PANEL  URINALYSIS, ROUTINE W REFLEX MICROSCOPIC  LIPASE, BLOOD   Imaging Review No results found.  EKG  Interpretation   None       MDM  No diagnosis found. Patient given meds here for pain as well as better. She has no signs of acute abdomen at this time. Suspect that she likely is suffering from GERD. She is stable for discharge    Toy Baker, MD 10/22/13 2024

## 2013-10-22 NOTE — ED Notes (Signed)
Per EMS- patient reports that she ate a hot dog at work at 1000 and at 1100 started having epigastric and center abdominal pain. Patient c/o constant nausea and states she vomited x 1. Patient c/o sharp pain that she rates 10/10

## 2013-10-23 LAB — URINE CULTURE

## 2014-01-14 IMAGING — US US ABDOMEN COMPLETE
1 series · 14 of 25 positions shown · non-contrast
Comparison: 03/10/2010 CT with contrast

CLINICAL DATA: Epigastric abdominal pain, nausea and vomiting

EXAM:
ULTRASOUND ABDOMEN COMPLETE

[Series 1: us abdomen complete · 0.28mm/px · 14 of 77 slices shown]
[im 1/77]
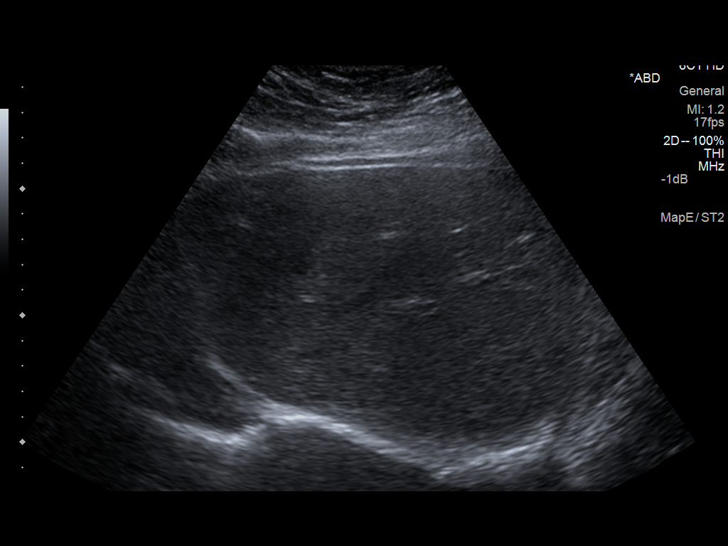
[im 7/77]
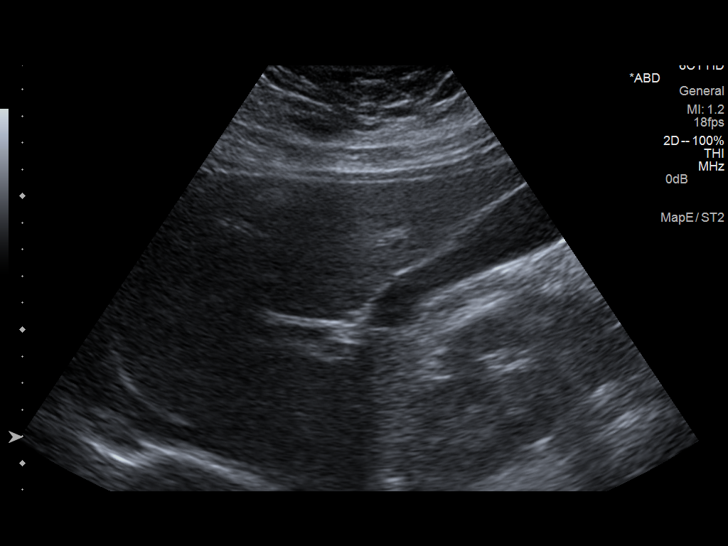
[im 13/77]
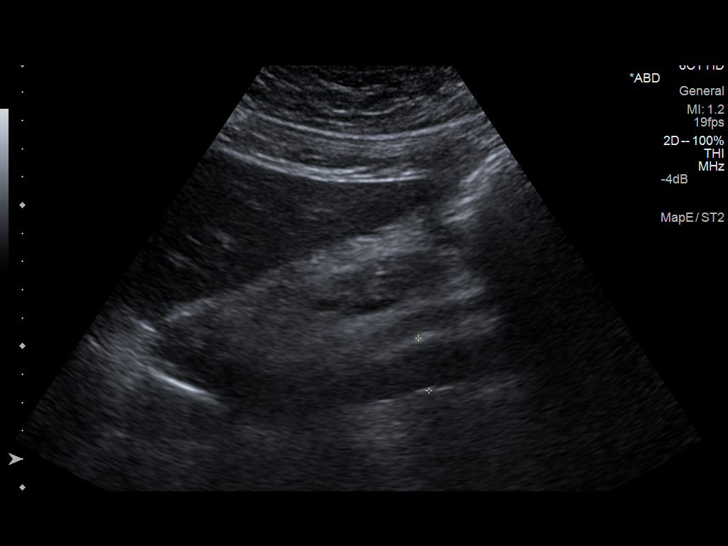
[im 20/77]
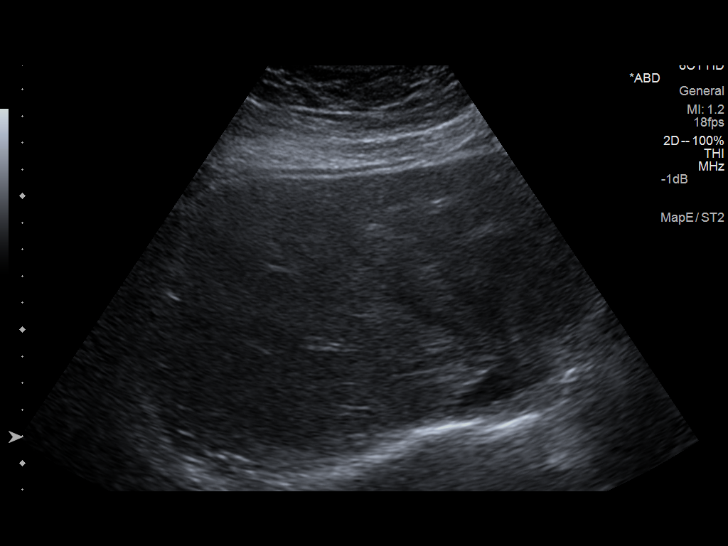
[im 26/77]
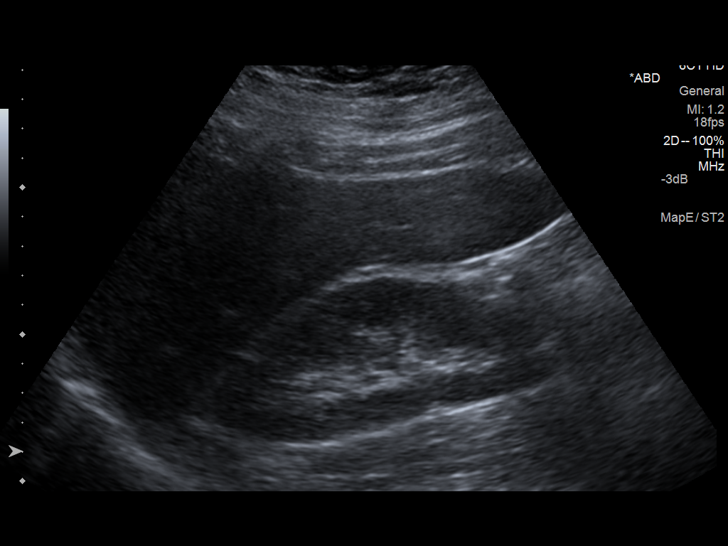
[im 29/77]
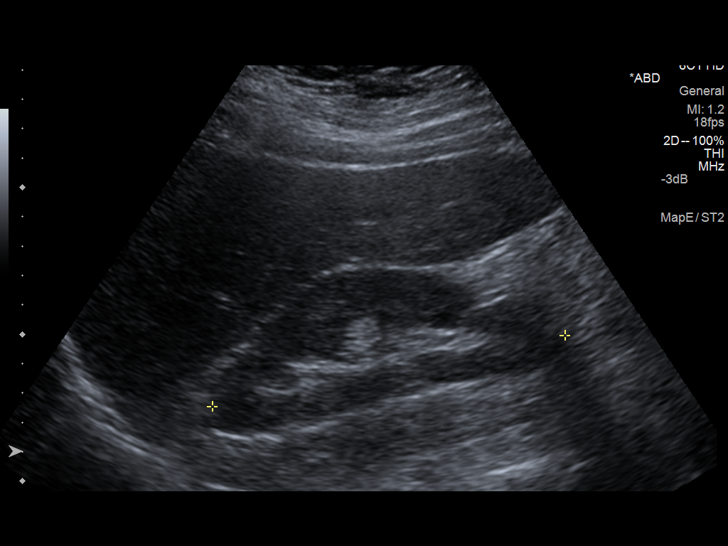
[im 35/77]
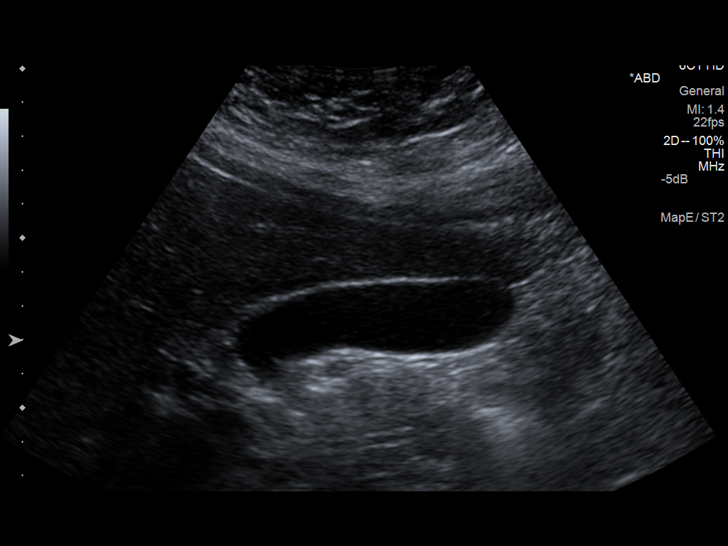
[im 42/77]
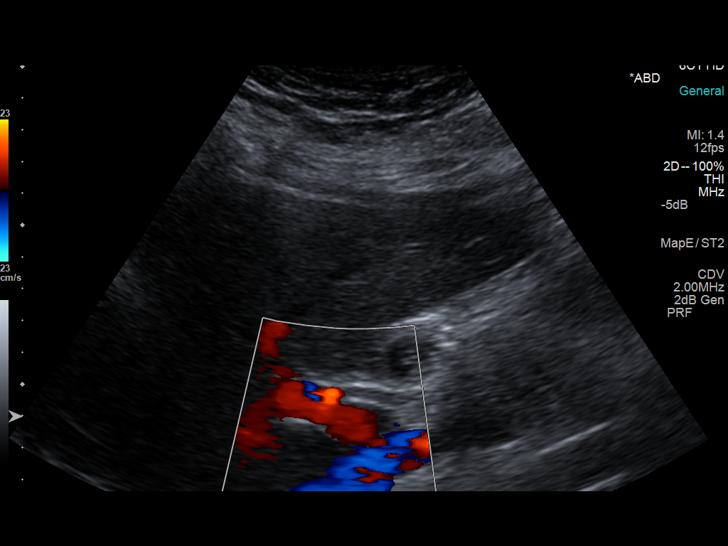
[im 48/77]
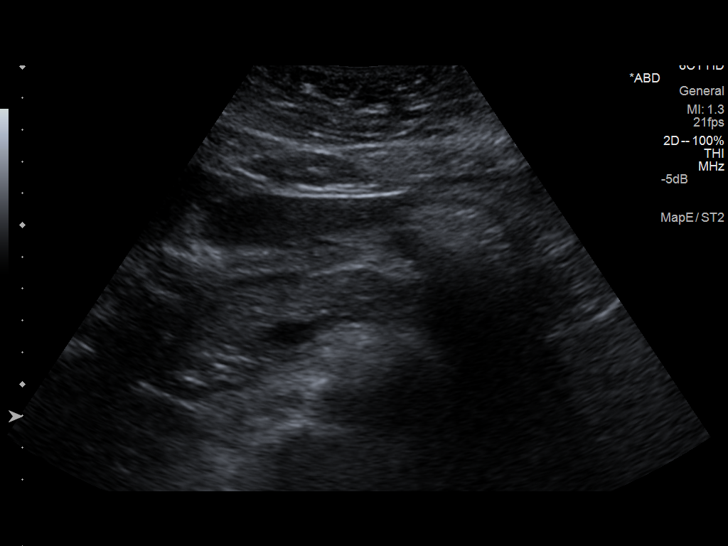
[im 51/77]
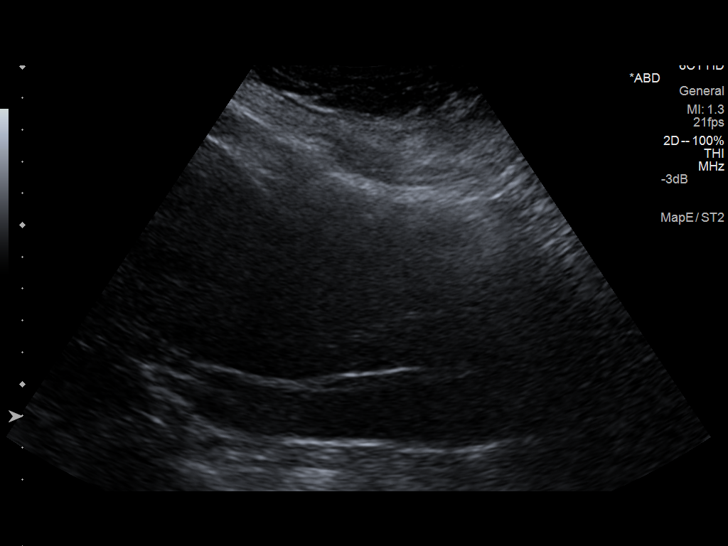
[im 58/77]
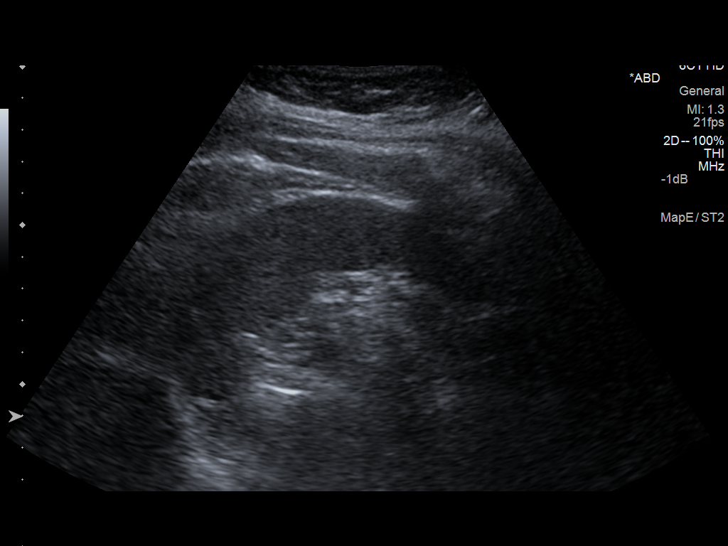
[im 64/77]
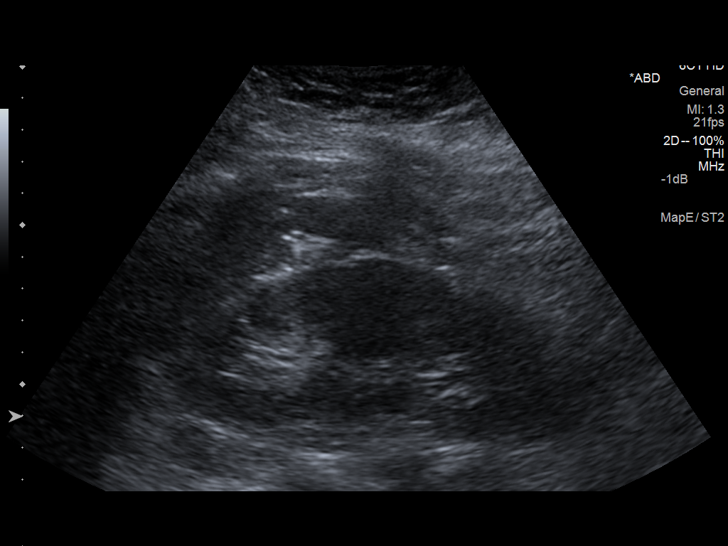
[im 70/77]
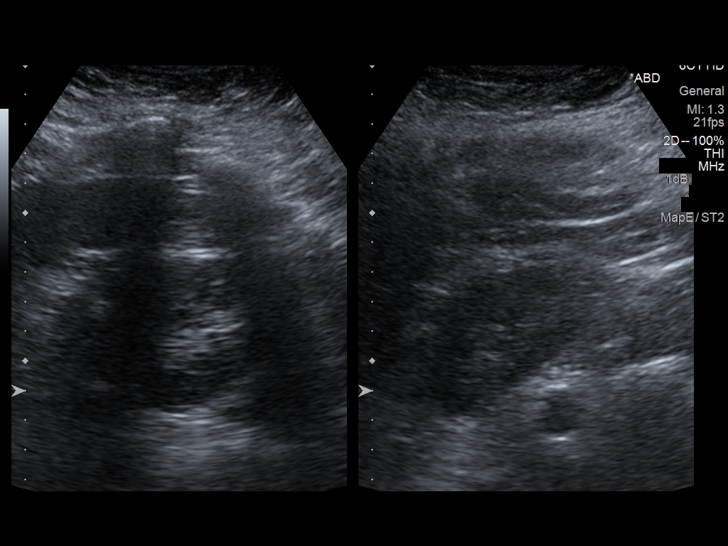
[im 77/77]
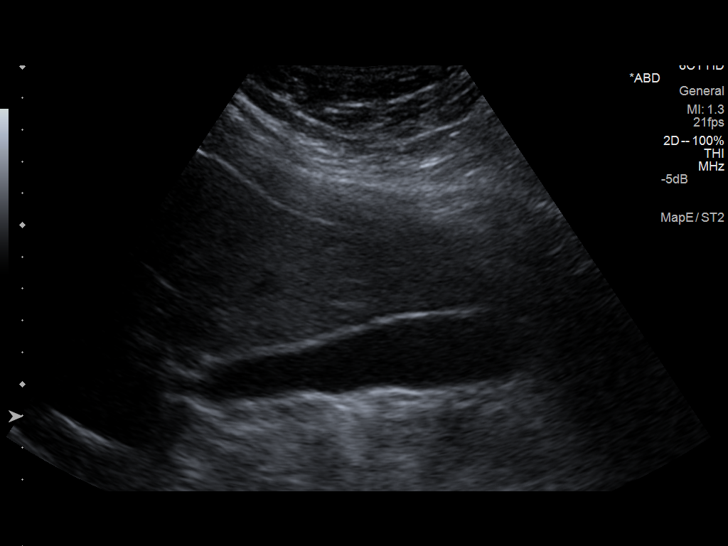

[14 of 25 positions shown; findings below may reference images not displayed]

FINDINGS: Gallbladder

No gallstones or wall thickening visualized. No sonographic Murphy
sign noted.

Common bile duct

Diameter: 4 mm diameter. No dilatation or obstruction.

Liver

No focal lesion identified. Within normal limits in parenchymal
echogenicity.

IVC

No abnormality visualized.

Pancreas

Visualized portion unremarkable.

Spleen

Size and appearance within normal limits.

Right Kidney

Length: 12.3 cm. Echogenicity within normal limits. No mass or
hydronephrosis visualized.

Left Kidney

Length: 12.3 cm. Echogenicity within normal limits. No mass or
hydronephrosis visualized.

Abdominal aorta

No aneurysm visualized.
IMPRESSION: No acute finding by ultrasound.

## 2017-06-11 ENCOUNTER — Emergency Department (HOSPITAL_COMMUNITY)
Admission: EM | Admit: 2017-06-11 | Discharge: 2017-06-11 | Disposition: A | Discharge status: Home / Self Care | Payer: Self-pay | Attending: Emergency Medicine | Admitting: Emergency Medicine

## 2017-06-11 DIAGNOSIS — R197 Diarrhea, unspecified: Secondary | ICD-10-CM | POA: Insufficient documentation

## 2017-06-11 DIAGNOSIS — F17210 Nicotine dependence, cigarettes, uncomplicated: Secondary | ICD-10-CM | POA: Insufficient documentation

## 2017-06-11 DIAGNOSIS — R112 Nausea with vomiting, unspecified: Secondary | ICD-10-CM | POA: Insufficient documentation

## 2017-06-11 DIAGNOSIS — Z79899 Other long term (current) drug therapy: Secondary | ICD-10-CM | POA: Insufficient documentation

## 2017-06-11 LAB — COMPREHENSIVE METABOLIC PANEL
ALT: 15 U/L (ref 14–54)
AST: 18 U/L (ref 15–41)
Albumin: 4.4 g/dL (ref 3.5–5.0)
Alkaline Phosphatase: 66 U/L (ref 38–126)
Anion gap: 8 (ref 5–15)
BILIRUBIN TOTAL: 0.5 mg/dL (ref 0.3–1.2)
BUN: 10 mg/dL (ref 6–20)
CALCIUM: 9.3 mg/dL (ref 8.9–10.3)
CO2: 22 mmol/L (ref 22–32)
CREATININE: 0.79 mg/dL (ref 0.44–1.00)
Chloride: 108 mmol/L (ref 101–111)
GFR calc Af Amer: >60 mL/min (ref >60)
Glucose, Bld: 105 mg/dL — ABNORMAL HIGH (ref 65–99)
Potassium: 3.3 mmol/L — ABNORMAL LOW (ref 3.5–5.1)
Sodium: 138 mmol/L (ref 135–145)
TOTAL PROTEIN: 8.1 g/dL (ref 6.5–8.1)

## 2017-06-11 LAB — URINALYSIS, ROUTINE W REFLEX MICROSCOPIC
Bilirubin Urine: NEGATIVE
Glucose, UA: NEGATIVE mg/dL
Ketones, ur: 80 mg/dL — AB
LEUKOCYTES UA: NEGATIVE
NITRITE: NEGATIVE
PH: 5 (ref 5.0–8.0)
Protein, ur: NEGATIVE mg/dL
Specific Gravity, Urine: 1.026 (ref 1.005–1.030)

## 2017-06-11 LAB — CBC WITH DIFFERENTIAL/PLATELET
BASOS ABS: 0 10*3/uL (ref 0.0–0.1)
BASOS PCT: 0 %
EOS PCT: 0 %
Eosinophils Absolute: 0 10*3/uL (ref 0.0–0.7)
HCT: 46 % (ref 36.0–46.0)
Hemoglobin: 15.7 g/dL — ABNORMAL HIGH (ref 12.0–15.0)
Lymphocytes Relative: 6 %
Lymphs Abs: 0.4 10*3/uL — ABNORMAL LOW (ref 0.7–4.0)
MCH: 30.5 pg (ref 26.0–34.0)
MCHC: 34.1 g/dL (ref 30.0–36.0)
MCV: 89.3 fL (ref 78.0–100.0)
Monocytes Absolute: 0.2 10*3/uL (ref 0.1–1.0)
Monocytes Relative: 3 %
Neutro Abs: 5.9 10*3/uL (ref 1.7–7.7)
Neutrophils Relative %: 91 %
PLATELETS: 281 10*3/uL (ref 150–400)
RBC: 5.15 MIL/uL — ABNORMAL HIGH (ref 3.87–5.11)
RDW: 13.5 % (ref 11.5–15.5)
WBC: 6.5 10*3/uL (ref 4.0–10.5)

## 2017-06-11 LAB — I-STAT BETA HCG BLOOD, ED (MC, WL, AP ONLY): I-stat hCG, quantitative: <5 m[IU]/mL (ref <5)

## 2017-06-11 LAB — LIPASE, BLOOD: Lipase: 17 U/L (ref 11–51)

## 2017-06-11 MED ORDER — PROMETHAZINE HCL 25 MG PO TABS: 25.0000 mg | ORAL_TABLET | Freq: Four times a day (QID) | ORAL | 0 refills | Status: DC | PRN

## 2017-06-11 MED ORDER — ONDANSETRON HCL 4 MG/2ML IJ SOLN
4.0000 mg | Freq: Once | INTRAMUSCULAR | Status: AC
  Administered 2017-06-11: 4 mg via INTRAVENOUS
  Filled 2017-06-11: qty 2

## 2017-06-11 MED ORDER — PROMETHAZINE HCL 25 MG/ML IJ SOLN
12.5000 mg | Freq: Once | INTRAMUSCULAR | Status: AC
  Administered 2017-06-11: 12.5 mg via INTRAVENOUS
  Filled 2017-06-11: qty 1

## 2017-06-11 MED ORDER — SODIUM CHLORIDE 0.9 % IV BOLUS (SEPSIS)
1000.0000 mL | Freq: Once | INTRAVENOUS | Status: AC
  Administered 2017-06-11: 1000 mL via INTRAVENOUS

## 2017-06-11 NOTE — ED Notes (Signed)
Bed: WA21 Expected date:  Expected time:  Means of arrival:  Comments: EMS syncope  

## 2017-06-11 NOTE — ED Triage Notes (Signed)
Per EMS, pt was at home and experienced a syncopal episode. Family reports seizure activity with LOC. Family states that pt did not hit her head and just collapsed. Pt reports eating shrimp at a cookout over the weekend and n/v/d since 0130 this morning. 4 mg zofran ODT administered by EMS.

## 2017-06-11 NOTE — ED Provider Notes (Signed)
[facility] Provider Note   CSN: [id number] Arrival date & time: 06/11/17  1532     History   Chief Complaint Chief Complaint  Patient presents with  . Loss of Consciousness  . N/V/D    HPI [name] is a 37 y.o. female.  She presents for evaluation of nausea vomiting and diarrhea after eating a shrimp dinner last night.  Several other people ate it as well, but did not get sick.  She feels warm and has had some chills.  This morning after a bout of vomiting she laid on the floor and then "passed out."  At that time, she was "clammy."  She denies blood in emesis, or diarrhea.  No other chronic illnesses.  No other recent illnesses.  There are no other known modifying factors.   HPI  Past Medical History:  Diagnosis Date  . Obesity     There are no active problems to display for this patient.   Past Surgical History:  Procedure Laterality Date  . ABDOMINAL HYSTERECTOMY      OB History    No data available       Home Medications    Prior to Admission medications   Medication Sig Start Date End Date Taking? Authorizing Provider  oxyCODONE-acetaminophen (PERCOCET/ROXICET) 5-325 MG per tablet Take 2 tablets by mouth every 4 (four) hours as needed for pain. Patient not taking: Reported on 06/11/2017 10/22/13   Lorre NickAllen, Anthony, MD  promethazine (PHENERGAN) 25 MG tablet Take 1 tablet (25 mg total) by mouth every 6 (six) hours as needed for nausea or vomiting. 06/11/17   Mancel BaleWentz, Dimitriy Carreras, MD  sucralfate (CARAFATE) 1 G tablet Take 1 tablet (1 g total) by mouth 4 (four) times daily. Patient not taking: Reported on 06/11/2017 10/22/13   Lorre NickAllen, Anthony, MD    Family History No family history on file.  Social History Social History  Substance Use Topics  . Smoking status: Current Every Day Smoker    Packs/day: 0.50    Types: Cigarettes  . Smokeless tobacco: Never Used  . Alcohol use No     Allergies   Patient has no known allergies.   Review of  Systems Review of Systems  All other systems reviewed and are negative.    Physical Exam Updated Vital Signs BP 112/60   Pulse 80   Temp 100.2 F (37.9 C) (Oral)   Resp (!) 25   SpO2 98%   Physical Exam  Constitutional: She is oriented to person, place, and time. She appears well-developed. No distress.  Obese  HENT:  Head: Normocephalic and atraumatic.  Eyes: Conjunctivae and EOM are normal. Pupils are equal, round, and reactive to light.  Neck: Normal range of motion and phonation normal. Neck supple.  Cardiovascular: Normal rate and regular rhythm.   Pulmonary/Chest: Effort normal and breath sounds normal. She exhibits no tenderness.  Abdominal: Soft. Bowel sounds are normal. She exhibits no distension and no mass. There is tenderness (Tender left upper lower quadrants, mild.). There is no rebound and no guarding. No hernia.  Musculoskeletal: Normal range of motion.  Neurological: She is alert and oriented to person, place, and time. She exhibits normal muscle tone.  Skin: Skin is warm and dry.  Psychiatric: She has a normal mood and affect. Her behavior is normal. Judgment and thought content normal.  Nursing note and vitals reviewed.    ED Treatments / Results  Labs (all labs ordered are listed, but only abnormal results are displayed) Labs Reviewed  COMPREHENSIVE METABOLIC PANEL - Abnormal; Notable for the following:       Result Value   Potassium 3.3 (*)    Glucose, Bld 105 (*)    All other components within normal limits  CBC WITH DIFFERENTIAL/PLATELET - Abnormal; Notable for the following:    RBC 5.15 (*)    Hemoglobin 15.7 (*)    Lymphs Abs 0.4 (*)    All other components within normal limits  URINALYSIS, ROUTINE W REFLEX MICROSCOPIC - Abnormal; Notable for the following:    Hgb urine dipstick SMALL (*)    Ketones, ur 80 (*)    Bacteria, UA RARE (*)    Squamous Epithelial / LPF 0-5 (*)    All other components within normal limits  LIPASE, BLOOD  I-STAT  BETA HCG BLOOD, ED ([facility], [facility], [facility] ONLY)    EKG  EKG Interpretation  Date/Time:  Tuesday June 11 2017 15:47:37 EDT Ventricular Rate:  85 PR Interval:    QRS Duration: 90 QT Interval:  357 QTC Calculation: 425 R Axis:   53 Text Interpretation:  Sinus rhythm Borderline low voltage, extremity leads since last tracing no significant change Confirmed by Mancel Bale [phone]) on 06/11/2017 4:17:25 PM       Radiology No results found.  Procedures Procedures (including critical care time)  Medications Ordered in ED Medications  sodium chloride 0.9 % bolus 1,000 mL (0 mLs Intravenous Stopped 06/11/17 1949)  ondansetron (ZOFRAN) injection 4 mg (4 mg Intravenous Given 06/11/17 1720)  promethazine (PHENERGAN) injection 12.5 mg (12.5 mg Intravenous Given 06/11/17 2016)     Initial Impression / Assessment and Plan / ED Course  I have reviewed the triage vital signs and the nursing notes.  Pertinent labs & imaging results that were available during my care of the patient were reviewed by me and considered in my medical decision making (see chart for details).      Patient Vitals for the past 24 hrs:  BP Temp Temp src Pulse Resp SpO2  06/11/17 2030 112/60 - - 80 (!) 25 98 %  06/11/17 1900 (!) 121/56 - - - (!) 25 -  06/11/17 1840 107/72 100.2 F (37.9 C) Oral 70 18 99 %  06/11/17 1548 130/75 100.1 F (37.8 C) Oral 86 (!) 21 100 %  06/11/17 1539 - - - - - 100 %    8:59 PM Reevaluation with update and discussion. After initial assessment and treatment, an updated evaluation reveals she is feeling better at this time.  Findings discussed with the patient and all questions answered. [name]    Final Clinical Impressions(s) / ED Diagnoses   Final diagnoses:  Nausea vomiting and diarrhea   Specific gastrointestinal disorder, improved with treatment in the emergency department.  Doubt serious bacterial infection metabolic instability or impending vascular collapse.  Nursing  Notes Reviewed/ Care Coordinated Applicable Imaging Reviewed Interpretation of Laboratory Data incorporated into ED treatment  The patient appears reasonably screened and/or stabilized for discharge and I doubt any other medical condition or other [facility] requiring further screening, evaluation, or treatment in the ED at this time prior to discharge.  Plan: Home Medications-continue usual medications at home; Home Treatments-gradually advance diet; return here if the recommended treatment, does not improve the symptoms; Recommended follow up-PCP, as needed   New Prescriptions New Prescriptions   PROMETHAZINE (PHENERGAN) 25 MG TABLET    Take 1 tablet (25 mg total) by mouth every 6 (six) hours as needed for nausea or vomiting.  Mancel Bale, MD 06/11/17 2100

## 2017-06-11 NOTE — Discharge Instructions (Signed)
Start with a clear liquid diet tonight and gradually advance to regular foods beginning tomorrow.  Follow-up with Dr. Diamond NickelBeatrice if not better in 1 or 2 days.

## 2018-10-05 ENCOUNTER — Encounter (HOSPITAL_COMMUNITY): Payer: Self-pay | Admitting: Emergency Medicine

## 2018-10-05 ENCOUNTER — Ambulatory Visit (HOSPITAL_COMMUNITY)
Admission: EM | Admit: 2018-10-05 | Discharge: 2018-10-05 | Disposition: A | Discharge status: Home / Self Care | Payer: Self-pay | Attending: Family Medicine | Admitting: Family Medicine

## 2018-10-05 DIAGNOSIS — K529 Noninfective gastroenteritis and colitis, unspecified: Secondary | ICD-10-CM

## 2018-10-05 MED ORDER — DIPHENOXYLATE-ATROPINE 2.5-0.025 MG PO TABS: 1.0000 | ORAL_TABLET | Freq: Four times a day (QID) | ORAL | 0 refills | Status: DC | PRN

## 2018-10-05 MED ORDER — ONDANSETRON HCL 4 MG PO TABS: 4.0000 mg | ORAL_TABLET | Freq: Three times a day (TID) | ORAL | 0 refills | Status: DC | PRN

## 2018-10-05 MED ORDER — FAMOTIDINE 20 MG PO TABS: 20.0000 mg | ORAL_TABLET | Freq: Two times a day (BID) | ORAL | 0 refills | Status: AC

## 2018-10-05 NOTE — ED Triage Notes (Signed)
Pt here for N/V/D; pt sts last vomited at 0200 today

## 2018-10-05 NOTE — Discharge Instructions (Signed)
Take all medication as directed. Follow-up with primary care provider if symptoms worsen or do not improve.

## 2018-10-05 NOTE — ED Provider Notes (Signed)
[facility]    CSN: [id number] Arrival date & time: 10/05/18  1414     History   Chief Complaint Chief Complaint  Patient presents with  . Emesis  . Diarrhea    HPI [name] is a 38 y.o. female.   HPI Patient is here today complaining of 2 days of loose stools, abdominal cramping, and nausea/vomiting. Stools are brown but loose in consistency.  She has had multiple episodes of loose stools since Friday. Vomitus is mostly clear.  She is hydrating well with water and ginger ale.  Reports decrease in appetite although is able to eat small meals.  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 She denies fever, chills, weakness, or fatigue.  No recent travel outside of the country and no known sick contacts. Past Medical History:  Diagnosis Date  . Obesity     There are no active problems to display for this patient.   Past Surgical History:  Procedure Laterality Date  . ABDOMINAL HYSTERECTOMY      OB History   None      Home Medications    Prior to Admission medications   Medication Sig Start Date End Date Taking? Authorizing Provider  oxyCODONE-acetaminophen (PERCOCET/ROXICET) 5-325 MG per tablet Take 2 tablets by mouth every 4 (four) hours as needed for pain. Patient not taking: Reported on 06/11/2017 10/22/13   Lorre Nick, MD  promethazine (PHENERGAN) 25 MG tablet Take 1 tablet (25 mg total) by mouth every 6 (six) hours as needed for nausea or vomiting. 06/11/17   Mancel Bale, MD  sucralfate (CARAFATE) 1 G tablet Take 1 tablet (1 g total) by mouth 4 (four) times daily. Patient not taking: Reported on 06/11/2017 10/22/13   Lorre Nick, MD    Family History History reviewed. No pertinent family history.  Social History Social History   Tobacco Use  . Smoking status: Current Every Day Smoker    Packs/day: 0.50    Types: Cigarettes  . Smokeless tobacco: Never Used  Substance  Use Topics  . Alcohol use: No  . Drug use: Yes    Types: Marijuana    Comment: occasionally     Allergies   Patient has no known allergies.   Review of Systems Review of Systems Pertinent negatives listed in HPI Physical Exam Triage Vital Signs ED Triage Vitals  Enc Vitals Group     BP 10/05/18 1443 132/85     Pulse Rate 10/05/18 1443 73     Resp 10/05/18 1443 18     Temp 10/05/18 1443 98 F (36.7 C)     Temp Source 10/05/18 1443 Oral     SpO2 10/05/18 1443 100 %     Weight --      Height --      Head Circumference --      Peak Flow --      Pain Score 10/05/18 1444 6     Pain Loc --      Pain Edu? --      Excl. in GC? --    No data found.  Updated Vital Signs BP 132/85 (BP Location: Left Arm)   Pulse 73   Temp 98 F (36.7 C) (Oral)   Resp 18   SpO2 100%   Visual Acuity Right Eye Distance:   Left Eye Distance:   Bilateral Distance:    Right Eye Near:   Left Eye Near:  Bilateral Near:     Physical Exam Constitutional: Patient appears well-developed and well-nourished. No distress. HENT: Normocephalic, atraumatic, External right and left ear normal. Oropharynx is clear and moist.  Eyes: Conjunctivae and EOM are normal. PERRLA, no scleral icterus. Neck: Normal ROM. Neck supple. No JVD. No tracheal deviation. No thyromegaly. CVS: RRR, S1/S2 +, no murmurs, no gallops, no carotid bruit.  Pulmonary: Effort and breath sounds normal, no stridor, rhonchi, wheezes, rales.  Abdominal: Soft. BS +, no distension,mild epigastric tenderness with deep palpation, rebound or guarding.  Neuro: Alert. Normal reflexes, muscle tone coordination. No cranial nerve deficit. Skin: Skin is warm and dry. No rash noted. Not diaphoretic. No erythema. No pallor. Psychiatric: Normal mood and affect. Behavior, judgment, thought content normal. UC Treatments / Results  Labs (all labs ordered are listed, but only abnormal results are displayed) Labs Reviewed - No data to  display  EKG None  Radiology No results found.  Procedures Procedures (including critical care time)  Medications Ordered in UC Medications - No data to display  Initial Impression / Assessment and Plan / UC Course  I have reviewed the triage vital signs and the nursing notes.  Pertinent labs & imaging results that were available during my care of the patient were reviewed by me and considered in my medical decision making (see chart for details).  Patient is well-appearing without distress, complaining today of 48 hours of GI symptoms with diarrhea. No recent out of country travel or sick contacts. Physical exam unremarkable with the exception of mild epigastric tenderness which was only reproducible with deep palpation. Likely gastroenteritis. Will treat symptoms only for now and recommend to continue to hydrate and advance diet as tolerated.  Patient verbalized understanding and agreement plan.  Final Clinical Impressions(s) / UC Diagnoses   Final diagnoses:  Gastroenteritis     Discharge Instructions     Take all medication as directed. Follow-up with primary care provider if symptoms worsen or do not improve.    ED Prescriptions    Medication Sig Dispense Auth. Provider   diphenoxylate-atropine (LOMOTIL) 2.5-0.025 MG tablet Take 1 tablet by mouth 4 (four) times daily as needed for diarrhea or loose stools. 16 tablet Bing Neighbors, FNP   famotidine (PEPCID) 20 MG tablet Take 1 tablet (20 mg total) by mouth 2 (two) times daily. 30 tablet Bing Neighbors, FNP   ondansetron (ZOFRAN) 4 MG tablet Take 1 tablet (4 mg total) by mouth every 8 (eight) hours as needed for nausea or vomiting. 20 tablet Bing Neighbors, FNP     Controlled Substance Prescriptions  Controlled Substance Registry consulted? Not Applicable   Bing Neighbors, FNP 10/05/18 1554

## 2018-11-06 DIAGNOSIS — I340 Nonrheumatic mitral (valve) insufficiency: Secondary | ICD-10-CM

## 2022-01-02 ENCOUNTER — Encounter (HOSPITAL_COMMUNITY): Payer: Self-pay | Admitting: Emergency Medicine

## 2022-01-02 ENCOUNTER — Other Ambulatory Visit: Payer: Self-pay

## 2022-01-02 ENCOUNTER — Ambulatory Visit (HOSPITAL_COMMUNITY)
Admission: EM | Admit: 2022-01-02 | Discharge: 2022-01-02 | Disposition: A | Discharge status: Home / Self Care | Payer: Self-pay | Attending: Emergency Medicine | Admitting: Emergency Medicine

## 2022-01-02 DIAGNOSIS — Z20822 Contact with and (suspected) exposure to covid-19: Secondary | ICD-10-CM | POA: Insufficient documentation

## 2022-01-02 DIAGNOSIS — J069 Acute upper respiratory infection, unspecified: Secondary | ICD-10-CM | POA: Insufficient documentation

## 2022-01-02 LAB — POC INFLUENZA A AND B ANTIGEN (URGENT CARE ONLY)
INFLUENZA A ANTIGEN, POC: NEGATIVE
INFLUENZA A ANTIGEN, POC: NEGATIVE
INFLUENZA B ANTIGEN, POC: NEGATIVE
INFLUENZA B ANTIGEN, POC: NEGATIVE

## 2022-01-02 LAB — SARS CORONAVIRUS 2 (TAT 6-24 HRS): SARS Coronavirus 2: POSITIVE — AB

## 2022-01-02 MED ORDER — FLUTICASONE PROPIONATE 50 MCG/ACT NA SUSP: 2.0000 | Freq: Every day | NASAL | 0 refills | Status: AC

## 2022-01-02 MED ORDER — AEROCHAMBER PLUS MISC: 2 refills | Status: AC

## 2022-01-02 MED ORDER — PROMETHAZINE-DM 6.25-15 MG/5ML PO SYRP: 5.0000 mL | ORAL_SOLUTION | Freq: Four times a day (QID) | ORAL | 0 refills | Status: DC | PRN

## 2022-01-02 MED ORDER — ALBUTEROL SULFATE HFA 108 (90 BASE) MCG/ACT IN AERS: 1.0000 | INHALATION_SPRAY | RESPIRATORY_TRACT | 0 refills | Status: AC | PRN

## 2022-01-02 NOTE — Discharge Instructions (Addendum)
I will contact you if and only if your flu is positive.  You can also call here and get your result and get the results Flonase, Promethazine DM for cough, Mucinex D, 2 puffs from albuterol inhaler every 4-6 hours as needed.  Make sure to use your spacer take 1 gram of tylenol with the 600 mg motrin up to 3-4 times a day as needed for pain and fever. This is an effective combination. Drink extra fluids.  Use a neti pot or the NeilMed sinus rinse with distilled water as often as you want to to reduce nasal congestion. Follow the directions on the box.  Go to www.goodrx.com  or www.costplusdrugs.com to look up your medications. This will give you a list of where you can find your prescriptions at the most affordable prices. Or ask the pharmacist what the cash price is, or if they have any other discount programs available to help make your medication more affordable. This can be less expensive than what you would pay with insurance.

## 2022-01-02 NOTE — ED Provider Notes (Signed)
HPI  SUBJECTIVE:  [name] is a 42 y.o. female who presents with 3 days of headaches, left ear pain, left cervical lymphadenopathy, nasal congestion, rhinorrhea, sinus pain and pressure, coughing, wheezing.  She reports 1 episode of vomiting today, has been tolerating p.o. since.  No fevers, body aches, change in hearing, otorrhea, sore throat, loss of sense of smell or taste, shortness of breath, nausea, diarrhea, abdominal pain, facial swelling, upper dental pain.  No known COVID or flu exposure.  She got the second dose of the COVID-vaccine and she also got this years flu vaccine.  No antibiotics in the past month.  She took Tylenol within 6 hours of evaluation.  States that she is unable to sleep at night secondary to the cough.  She has tried albuterol and Tylenol 1000 mg with improvement in her symptoms.  No aggravating factors.  She has a past medical history of asthma.  No history of COVID.  LMP: Status post hysterectomy.  PMD: On [employer].    Past Medical History:  Diagnosis Date   Obesity     Past Surgical History:  Procedure Laterality Date   ABDOMINAL HYSTERECTOMY      History reviewed. No pertinent family history.  Social History   Tobacco Use   Smoking status: Every Day    Packs/day: 0.50    Types: Cigarettes   Smokeless tobacco: Never  Substance Use Topics   Alcohol use: No   Drug use: Yes    Types: Marijuana    Comment: occasionally    No current facility-administered medications for this encounter.  Current Outpatient Medications:    albuterol (VENTOLIN HFA) 108 (90 Base) MCG/ACT inhaler, Inhale 1-2 puffs into the lungs every 4 (four) hours as needed for wheezing or shortness of breath., Disp: 1 each, Rfl: 0   fluticasone (FLONASE) 50 MCG/ACT nasal spray, Place 2 sprays into both nostrils daily., Disp: 16 g, Rfl: 0   promethazine-dextromethorphan (PROMETHAZINE-DM) 6.25-15 MG/5ML syrup, Take 5 mLs by mouth 4 (four) times daily as needed for cough.,  Disp: 118 mL, Rfl: 0   Spacer/Aero-Holding Chambers (AEROCHAMBER PLUS) inhaler, Use with inhaler, Disp: 1 each, Rfl: 2   famotidine (PEPCID) 20 MG tablet, Take 1 tablet (20 mg total) by mouth 2 (two) times daily., Disp: 30 tablet, Rfl: 0   ondansetron (ZOFRAN) 4 MG tablet, Take 1 tablet (4 mg total) by mouth every 8 (eight) hours as needed for nausea or vomiting., Disp: 20 tablet, Rfl: 0  No Known Allergies   ROS  As noted in HPI.   Physical Exam  BP 131/83 (BP Location: Right Arm)    Pulse 78    Temp 98.5 F (36.9 C) (Oral)    Resp 18    SpO2 97%   Constitutional: Well developed, well nourished, no acute distress Eyes:  EOMI, conjunctiva normal bilaterally HENT: Normocephalic, atraumatic,mucus membranes moist.  Positive nasal congestion.  No maxillary, frontal sinus tenderness.  Left TM erythematous, retracted, not dull or bulging.  No fluid behind the TM.  Right TM normal.  Bilateral external ears, EACs normal.  No pain with traction on pinna, palpation of tragus or mastoid bilaterally .oropharynx normal. Neck: Positive left-sided cervical lymphadenopathy Respiratory: Normal inspiratory effort, lungs clear bilaterally, good air movement. Cardiovascular: Normal rate, regular rhythm, no murmurs rubs or gallops GI: nondistended skin: No rash, skin intact Musculoskeletal: no deformities Neurologic: Alert & oriented x 3, no focal neuro deficits Psychiatric: Speech and behavior appropriate   ED Course  Medications - No data to display  Orders Placed This Encounter  Procedures   SARS CORONAVIRUS 2 (TAT 6-24 HRS) Nasopharyngeal Nasopharyngeal Swab    Standing Status:   Standing    Number of Occurrences:   1   Droplet precaution    Standing Status:   Standing    Number of Occurrences:   1   POC Influenza A & B Ag (Urgent Care)    Standing Status:   Standing    Number of Occurrences:   1   POC Influenza A & B Ag (Urgent Care Only)    Standing Status:   Standing    Number of  Occurrences:   1    Results for orders placed or performed during the hospital encounter of 01/02/22 (from the past 24 hour(s))  POC Influenza A & B Ag (Urgent Care)     Status: None   Collection Time: 01/02/22 11:00 AM  Result Value Ref Range   INFLUENZA A ANTIGEN, POC NEGATIVE NEGATIVE   INFLUENZA B ANTIGEN, POC NEGATIVE NEGATIVE  POC Influenza A & B Ag (Urgent Care Only)     Status: None   Collection Time: 01/02/22 11:08 AM  Result Value Ref Range   INFLUENZA A ANTIGEN, POC NEGATIVE NEGATIVE   INFLUENZA B ANTIGEN, POC NEGATIVE NEGATIVE   No results found.  ED Clinical Impression  1. Upper respiratory tract infection, unspecified type   2. Encounter for laboratory testing for COVID-19 virus      ED Assessment/Plan  Will check COVID, flu.  Will prescribe Molnupiravir or Tamiflu if either 1 of these are positive.  [phone]  Rapid flu negative.  In the meantime, presentation consistent with URI.  There is no evidence of suppurative otitis media at this time although it could be very early.  Home with Flonase, Promethazine DM, saline nasal irrigation, Mucinex D, will refill her albuterol inhaler and prescribe a spacer.  Work note for today and tomorrow.  Follow-up with PMD as needed.  Discussed labs, MDM, treatment plan, and plan for follow-up with patient.patient agrees with plan.   Meds ordered this encounter  Medications   promethazine-dextromethorphan (PROMETHAZINE-DM) 6.25-15 MG/5ML syrup    Sig: Take 5 mLs by mouth 4 (four) times daily as needed for cough.    Dispense:  118 mL    Refill:  0   fluticasone (FLONASE) 50 MCG/ACT nasal spray    Sig: Place 2 sprays into both nostrils daily.    Dispense:  16 g    Refill:  0   Spacer/Aero-Holding Chambers (AEROCHAMBER PLUS) inhaler    Sig: Use with inhaler    Dispense:  1 each    Refill:  2    Please educate patient on use   albuterol (VENTOLIN HFA) 108 (90 Base) MCG/ACT inhaler    Sig: Inhale 1-2 puffs into the lungs  every 4 (four) hours as needed for wheezing or shortness of breath.    Dispense:  1 each    Refill:  0      *This clinic note was created using Scientist, clinical (histocompatibility and immunogenetics). Therefore, there may be occasional mistakes despite careful proofreading.  ?    Domenick Gong, MD 01/03/22 1958

## 2022-01-02 NOTE — ED Triage Notes (Signed)
Since this weekend having headache, sore throat, chills and sweats.

## 2023-06-04 ENCOUNTER — Other Ambulatory Visit: Payer: Self-pay

## 2023-06-04 ENCOUNTER — Encounter: Payer: Self-pay | Admitting: Emergency Medicine

## 2023-06-04 ENCOUNTER — Ambulatory Visit
Admission: EM | Admit: 2023-06-04 | Discharge: 2023-06-04 | Disposition: A | Discharge status: Home / Self Care | Payer: Self-pay | Attending: Family Medicine | Admitting: Family Medicine

## 2023-06-04 DIAGNOSIS — K529 Noninfective gastroenteritis and colitis, unspecified: Secondary | ICD-10-CM

## 2023-06-04 MED ORDER — ONDANSETRON 4 MG PO TBDP: 4.0000 mg | ORAL_TABLET | Freq: Three times a day (TID) | ORAL | 0 refills | Status: AC | PRN

## 2023-06-04 NOTE — Discharge Instructions (Signed)
Ondansetron dissolved in the mouth every 8 hours as needed for nausea or vomiting. Clear liquids(water, gatorade/pedialyte, ginger ale/sprite, chicken broth/soup) and bland things(crackers/toast, rice, potato, bananas) to eat. Avoid acidic foods like lemon/lime/orange/tomato, and avoid greasy/spicy foods.  Try Imodium over-the-counter for the diarrhea.  Take it according to the package instructions  If you continue to vomit and have frequent stools despite the medications, and please consider going to the emergency room for further evaluation

## 2023-06-04 NOTE — ED Provider Notes (Signed)
[facility]    CSN: [id number] Arrival date & time: 06/04/23  1015      History   Chief Complaint Chief Complaint  Patient presents with   Vomiting    HPI [name] is a 43 y.o. female.   HPI Here for nausea and vomiting and diarrhea.  Symptoms began on the morning of June 2 and at first she was mostly vomiting.  She is thrown up just once in the last 12 hours, but she is now having very frequent diarrhea.  She had a little bit of blood in the stool.  No fever or chills and no cough or congestion    No allergies to medications   she has had a hysterectomy    Past Medical History:  Diagnosis Date   Obesity     There are no problems to display for this patient.   Past Surgical History:  Procedure Laterality Date   ABDOMINAL HYSTERECTOMY      OB History   No obstetric history on file.      Home Medications    Prior to Admission medications   Medication Sig Start Date End Date Taking? Authorizing Provider  ondansetron (ZOFRAN-ODT) 4 MG disintegrating tablet Take 1 tablet (4 mg total) by mouth every 8 (eight) hours as needed for nausea or vomiting. 06/04/23  Yes Zenia Resides, MD  albuterol (VENTOLIN HFA) 108 (90 Base) MCG/ACT inhaler Inhale 1-2 puffs into the lungs every 4 (four) hours as needed for wheezing or shortness of breath. 01/02/22   Domenick Gong, MD  famotidine (PEPCID) 20 MG tablet Take 1 tablet (20 mg total) by mouth 2 (two) times daily. 10/05/18   Bing Neighbors, NP  fluticasone (FLONASE) 50 MCG/ACT nasal spray Place 2 sprays into both nostrils daily. 01/02/22   Domenick Gong, MD  Spacer/Aero-Holding Chambers (AEROCHAMBER PLUS) inhaler Use with inhaler 01/02/22   Domenick Gong, MD    Family History History reviewed. No pertinent family history.  Social History Social History   Tobacco Use   Smoking status: Every Day    Packs/day: .5    Types: Cigarettes   Smokeless tobacco: Never  Substance Use Topics    Alcohol use: No   Drug use: Yes    Types: Marijuana    Comment: occasionally     Allergies   Patient has no known allergies.   Review of Systems Review of Systems   Physical Exam Triage Vital Signs ED Triage Vitals [06/04/23 1027]  Enc Vitals Group     BP 137/89     Pulse Rate 67     Resp 18     Temp 98.5 F (36.9 C)     Temp Source Oral     SpO2 99 %     Weight      Height      Head Circumference      Peak Flow      Pain Score 0     Pain Loc      Pain Edu?      Excl. in GC?    No data found.  Updated Vital Signs BP 137/89 (BP Location: Left Arm)   Pulse 67   Temp 98.5 F (36.9 C) (Oral)   Resp 18   SpO2 99%   Visual Acuity Right Eye Distance:   Left Eye Distance:   Bilateral Distance:    Right Eye Near:   Left Eye Near:    Bilateral Near:     Physical  Exam Vitals reviewed.  Constitutional:      General: She is not in acute distress.    Appearance: She is not toxic-appearing.  HENT:     Mouth/Throat:     Mouth: Mucous membranes are moist.     Pharynx: No oropharyngeal exudate or posterior oropharyngeal erythema.  Eyes:     Extraocular Movements: Extraocular movements intact.     Conjunctiva/sclera: Conjunctivae normal.     Pupils: Pupils are equal, round, and reactive to light.  Cardiovascular:     Rate and Rhythm: Normal rate and regular rhythm.     Heart sounds: No murmur heard. Pulmonary:     Effort: Pulmonary effort is normal. No respiratory distress.     Breath sounds: No stridor. No wheezing, rhonchi or rales.  Abdominal:     General: There is no distension.     Palpations: Abdomen is soft.     Tenderness: There is no abdominal tenderness. There is no guarding.  Musculoskeletal:     Cervical back: Neck supple.  Lymphadenopathy:     Cervical: No cervical adenopathy.  Skin:    Capillary Refill: Capillary refill takes less than 2 seconds.     Coloration: Skin is not jaundiced or pale.  Neurological:     General: No focal deficit  present.     Mental Status: She is alert and oriented to person, place, and time.  Psychiatric:        Behavior: Behavior normal.      UC Treatments / Results  Labs (all labs ordered are listed, but only abnormal results are displayed) Labs Reviewed - No data to display  EKG   Radiology No results found.  Procedures Procedures (including critical care time)  Medications Ordered in UC Medications - No data to display  Initial Impression / Assessment and Plan / UC Course  I have reviewed the triage vital signs and the nursing notes.  Pertinent labs & imaging results that were available during my care of the patient were reviewed by me and considered in my medical decision making (see chart for details).       Vital signs are reassuring with normal heart rate and normal blood pressure. Zofran is sent in for the nausea. I have asked her to try Imodium for the diarrhea and we discussed clear liquids.  She will proceed to the emergency room if the therapy is not helping Final Clinical Impressions(s) / UC Diagnoses   Final diagnoses:  Gastroenteritis     Discharge Instructions      Ondansetron dissolved in the mouth every 8 hours as needed for nausea or vomiting. Clear liquids(water, gatorade/pedialyte, ginger ale/sprite, chicken broth/soup) and bland things(crackers/toast, rice, potato, bananas) to eat. Avoid acidic foods like lemon/lime/orange/tomato, and avoid greasy/spicy foods.  Try Imodium over-the-counter for the diarrhea.  Take it according to the package instructions  If you continue to vomit and have frequent stools despite the medications, and please consider going to the emergency room for further evaluation     ED Prescriptions     Medication Sig Dispense Auth. Provider   ondansetron (ZOFRAN-ODT) 4 MG disintegrating tablet Take 1 tablet (4 mg total) by mouth every 8 (eight) hours as needed for nausea or vomiting. 10 tablet Marlinda Mike Janace Aris, MD       PDMP not reviewed this encounter.   Zenia Resides, MD 06/04/23 1041

## 2023-06-04 NOTE — ED Triage Notes (Signed)
Pt here for N/V/D x 3 days  

## 2023-09-22 ENCOUNTER — Ambulatory Visit (INDEPENDENT_AMBULATORY_CARE_PROVIDER_SITE_OTHER): Payer: Self-pay

## 2023-09-22 ENCOUNTER — Ambulatory Visit
Admission: EM | Admit: 2023-09-22 | Discharge: 2023-09-22 | Disposition: A | Discharge status: Home / Self Care | Payer: Self-pay | Attending: Internal Medicine | Admitting: Internal Medicine

## 2023-09-22 DIAGNOSIS — W19XXXA Unspecified fall, initial encounter: Secondary | ICD-10-CM

## 2023-09-22 DIAGNOSIS — M25572 Pain in left ankle and joints of left foot: Secondary | ICD-10-CM

## 2023-09-22 NOTE — ED Provider Notes (Signed)
[facility]    CSN: [id number] Arrival date & time: 09/22/23  1114      History   Chief Complaint Chief Complaint  Patient presents with   Ankle Pain    HPI [name] is a 44 y.o. female.   Patient presents with left ankle pain after a fall that occurred about 3 days ago at work.  Patient reports that there was grease and water on the floor causing her to slip and fall.  Denies hitting head or losing consciousness.  She is not exactly sure how she hurt her left ankle.  Has been taking ibuprofen and Tylenol with minimal improvement.  Denies numbness or tingling.   Ankle Pain   Past Medical History:  Diagnosis Date   Obesity     There are no problems to display for this patient.   Past Surgical History:  Procedure Laterality Date   ABDOMINAL HYSTERECTOMY      OB History   No obstetric history on file.      Home Medications    Prior to Admission medications   Medication Sig Start Date End Date Taking? Authorizing Provider  albuterol (VENTOLIN HFA) 108 (90 Base) MCG/ACT inhaler Inhale 1-2 puffs into the lungs every 4 (four) hours as needed for wheezing or shortness of breath. 01/02/22   Domenick Gong, MD  famotidine (PEPCID) 20 MG tablet Take 1 tablet (20 mg total) by mouth 2 (two) times daily. 10/05/18   Bing Neighbors, NP  fluticasone (FLONASE) 50 MCG/ACT nasal spray Place 2 sprays into both nostrils daily. 01/02/22   Domenick Gong, MD  ondansetron (ZOFRAN-ODT) 4 MG disintegrating tablet Take 1 tablet (4 mg total) by mouth every 8 (eight) hours as needed for nausea or vomiting. 06/04/23   Zenia Resides, MD  Spacer/Aero-Holding Chambers (AEROCHAMBER PLUS) inhaler Use with inhaler 01/02/22   Domenick Gong, MD    Family History No family history on file.  Social History Social History   Tobacco Use   Smoking status: Every Day    Current packs/day: 0.50    Types: Cigarettes   Smokeless tobacco: Never  Substance Use Topics    Alcohol use: No   Drug use: Not Currently    Types: Marijuana    Comment: occasionally     Allergies   Patient has no known allergies.   Review of Systems Review of Systems Per HPI  Physical Exam Triage Vital Signs ED Triage Vitals  Encounter Vitals Group     BP 09/22/23 1201 (!) 125/98     Systolic BP Percentile --      Diastolic BP Percentile --      Pulse Rate 09/22/23 1201 95     Resp --      Temp 09/22/23 1201 98.5 F (36.9 C)     Temp Source 09/22/23 1201 Oral     SpO2 09/22/23 1201 100 %     Weight 09/22/23 1159 232 lb (105.2 kg)     Height 09/22/23 1159 5\' 6"  (1.676 m)     Head Circumference --      Peak Flow --      Pain Score 09/22/23 1159 10     Pain Loc --      Pain Education --      Exclude from Growth Chart --    No data found.  Updated Vital Signs BP (!) 125/98 (BP Location: Right Arm)   Pulse 95   Temp 98.5 F (36.9 C) (Oral)  Ht 5\' 6"  (1.676 m)   Wt 232 lb (105.2 kg)   SpO2 100%   BMI 37.45 kg/m   Visual Acuity Right Eye Distance:   Left Eye Distance:   Bilateral Distance:    Right Eye Near:   Left Eye Near:    Bilateral Near:     Physical Exam Constitutional:      General: She is not in acute distress.    Appearance: Normal appearance. She is not toxic-appearing or diaphoretic.  HENT:     Head: Normocephalic and atraumatic.  Eyes:     Extraocular Movements: Extraocular movements intact.     Conjunctiva/sclera: Conjunctivae normal.  Pulmonary:     Effort: Pulmonary effort is normal.  Musculoskeletal:     Comments: Patient has tenderness to palpation to lateral malleolus of left ankle that extends into the posterior ankle.  Mild swelling noted to this area as well.  No tenderness to foot.  Patient can wiggle toes.  No lacerations, abrasions, discoloration noted.  Patient appears to be neurovascularly intact.  Neurological:     General: No focal deficit present.     Mental Status: She is alert and oriented to person, place, and  time. Mental status is at baseline.  Psychiatric:        Mood and Affect: Mood normal.        Behavior: Behavior normal.        Thought Content: Thought content normal.        Judgment: Judgment normal.      UC Treatments / Results  Labs (all labs ordered are listed, but only abnormal results are displayed) Labs Reviewed - No data to display  EKG   Radiology DG Ankle Complete Left  Result Date: 09/22/2023 CLINICAL DATA:  43 year old female with history of trauma from a fall. Left ankle pain. EXAM: LEFT ANKLE COMPLETE - 3+ VIEW COMPARISON:  Left ankle radiograph 04/14/2010. FINDINGS: Three views of the left ankle demonstrate no acute displaced fracture, subluxation or dislocation. Soft tissue swelling is noted diffusely around the ankle joint. Plantar and dorsal calcaneal enthesophytes. IMPRESSION: 1. Diffuse soft tissue swelling surrounding the left ankle joint, without underlying acute osseous abnormality. Electronically Signed   By: Trudie Reed M.D.   On: 09/22/2023 13:26    Procedures Procedures (including critical care time)  Medications Ordered in UC Medications - No data to display  Initial Impression / Assessment and Plan / UC Course  I have reviewed the triage vital signs and the nursing notes.  Pertinent labs & imaging results that were available during my care of the patient were reviewed by me and considered in my medical decision making (see chart for details).     X-ray was negative for any acute bony abnormality.  Suspect ankle sprain.  No concern for tendon rupture on exam.  Advised patient of RICE.  Ace wrap applied by clinical staff.  Advised patient not to sleep in ace wrap.  Encouraged safe over-the-counter pain relievers and following with orthopedist at provided phone number if symptoms persist or worsen.  Patient verbalized understanding and was agreeable with plan. Final Clinical Impressions(s) / UC Diagnoses   Final diagnoses:  Acute left ankle  pain  Fall, initial encounter     Discharge Instructions      X-ray was normal.  Ace wrap applied.  Do not sleep in this.  Elevate and apply ice.  Follow-up with orthopedist if pain persist or worsens.     ED Prescriptions   None  PDMP not reviewed this encounter.   Gustavus Bryant, [place] 09/22/23 [phone]

## 2023-09-22 NOTE — Discharge Instructions (Signed)
X-ray was normal.  Ace wrap applied.  Do not sleep in this.  Elevate and apply ice.  Follow-up with orthopedist if pain persist or worsens.

## 2023-09-22 NOTE — ED Triage Notes (Signed)
Patient reports she fell at work on Friday and injured left ankle. Treated with epsom salt, alcohol soak. Took Ibuprofen.

## 2024-01-07 ENCOUNTER — Ambulatory Visit
Admission: EM | Admit: 2024-01-07 | Discharge: 2024-01-07 | Disposition: A | Discharge status: Home / Self Care | Payer: Self-pay | Attending: Family Medicine | Admitting: Family Medicine

## 2024-01-07 DIAGNOSIS — J069 Acute upper respiratory infection, unspecified: Secondary | ICD-10-CM

## 2024-01-07 DIAGNOSIS — J039 Acute tonsillitis, unspecified: Secondary | ICD-10-CM

## 2024-01-07 MED ORDER — PROMETHAZINE-DM 6.25-15 MG/5ML PO SYRP
5.0000 mL | ORAL_SOLUTION | Freq: Three times a day (TID) | ORAL | 0 refills | Status: AC | PRN
Start: 2024-01-07 — End: ?

## 2024-01-07 MED ORDER — AMOXICILLIN 875 MG PO TABS
875.0000 mg | ORAL_TABLET | Freq: Two times a day (BID) | ORAL | 0 refills | Status: AC
Start: 2024-01-07 — End: ?

## 2024-01-07 NOTE — ED Provider Notes (Signed)
 [facility]    CSN: [id number] Arrival date & time: 01/07/24  1648      History   Chief Complaint Chief Complaint  Patient presents with   URI    3 days    HPI [name] is a 44 y.o. female.   HPI Patient presents today with sore throat, headache, left ear pain x 3 days.  She has been taking Tylenol .  She has not had any fever.  Denies any body aches, shortness of breath, wheezing, nausea or vomiting.  No known sick contacts. Past Medical History:  Diagnosis Date   Obesity     There are no active problems to display for this patient.   Past Surgical History:  Procedure Laterality Date   ABDOMINAL HYSTERECTOMY      OB History   No obstetric history on file.      Home Medications    Prior to Admission medications   Medication Sig Start Date End Date Taking? Authorizing Provider  amoxicillin  (AMOXIL ) 875 MG tablet Take 1 tablet (875 mg total) by mouth 2 (two) times daily. 01/07/24  Yes Arloa Suzen RAMAN, NP  promethazine -dextromethorphan (PROMETHAZINE -DM) 6.25-15 MG/5ML syrup Take 5 mLs by mouth 3 (three) times daily as needed for cough. 01/07/24  Yes Arloa Suzen RAMAN, NP  albuterol  (VENTOLIN  HFA) 108 (90 Base) MCG/ACT inhaler Inhale 1-2 puffs into the lungs every 4 (four) hours as needed for wheezing or shortness of breath. 01/02/22   Mortenson, Ashley, MD  famotidine  (PEPCID ) 20 MG tablet Take 1 tablet (20 mg total) by mouth 2 (two) times daily. 10/05/18   Arloa Suzen RAMAN, NP  fluticasone  (FLONASE ) 50 MCG/ACT nasal spray Place 2 sprays into both nostrils daily. 01/02/22   Mortenson, Ashley, MD  ondansetron  (ZOFRAN -ODT) 4 MG disintegrating tablet Take 1 tablet (4 mg total) by mouth every 8 (eight) hours as needed for nausea or vomiting. 06/04/23   Vonna Sharlet POUR, MD  Spacer/Aero-Holding Chambers (AEROCHAMBER PLUS) inhaler Use with inhaler 01/02/22   Van Knee, MD    Family History No family history on file.  Social History Social  History   Tobacco Use   Smoking status: Every Day    Current packs/day: 0.50    Types: Cigarettes   Smokeless tobacco: Never  Substance Use Topics   Alcohol use: No   Drug use: Not Currently    Types: Marijuana    Comment: occasionally     Allergies   Patient has no known allergies.   Review of Systems Review of Systems Pertinent negatives listed in HPI   Physical Exam Triage Vital Signs ED Triage Vitals  Encounter Vitals Group     BP 01/07/24 1949 (!) 139/94     Systolic BP Percentile --      Diastolic BP Percentile --      Pulse Rate 01/07/24 1949 74     Resp 01/07/24 1949 20     Temp 01/07/24 1949 98 F (36.7 C)     Temp src --      SpO2 01/07/24 1949 98 %     Weight 01/07/24 1947 237 lb (107.5 kg)     Height 01/07/24 1947 5' 5 (1.651 m)     Head Circumference --      Peak Flow --      Pain Score 01/07/24 1947 7     Pain Loc --      Pain Education --      Exclude from Growth Chart --  No data found.  Updated Vital Signs BP (!) 139/94 (BP Location: Right Wrist)   Pulse 74   Temp 98 F (36.7 C)   Resp 20   Ht 5' 5 (1.651 m)   Wt 237 lb (107.5 kg)   SpO2 98%   BMI 39.44 kg/m   Visual Acuity Right Eye Distance:   Left Eye Distance:   Bilateral Distance:    Right Eye Near:   Left Eye Near:    Bilateral Near:     Physical Exam Vitals reviewed.  Constitutional:      Appearance: Normal appearance.  HENT:     Head: Normocephalic.     Right Ear: Tympanic membrane, ear canal and external ear normal.     Left Ear: Tympanic membrane, ear canal and external ear normal.     Nose: Congestion and rhinorrhea present.     Mouth/Throat:     Pharynx: Posterior oropharyngeal erythema and uvula swelling present.     Tonsils: 3+ on the right. 3+ on the left.  Eyes:     General:        Right eye: No discharge.        Left eye: No discharge.  Cardiovascular:     Rate and Rhythm: Regular rhythm. Bradycardia present.  Pulmonary:     Effort: Pulmonary  effort is normal.     Breath sounds: Normal breath sounds.  Musculoskeletal:     Cervical back: Normal range of motion and neck supple.  Lymphadenopathy:     Cervical: Cervical adenopathy present.  Skin:    General: Skin is warm and dry.  Neurological:     General: No focal deficit present.     Mental Status: She is alert and oriented to person, place, and time.      UC Treatments / Results  Labs (all labs ordered are listed, but only abnormal results are displayed) Labs Reviewed - No data to display  EKG   Radiology No results found.  Procedures Procedures (including critical care time)  Medications Ordered in UC Medications - No data to display  Initial Impression / Assessment and Plan / UC Course  I have reviewed the triage vital signs and the nursing notes.  Pertinent labs & imaging results that were available during my care of the patient were reviewed by me and considered in my medical decision making (see chart for details).    1. Acute tonsillitis, unspecified etiology (Primary) - amoxicillin  (AMOXIL ) 875 MG tablet; Take 1 tablet (875 mg total) by mouth 2 (two) times daily.  Dispense: 20 tablet; Refill: 0  2. Acute upper respiratory infection - promethazine -dextromethorphan (PROMETHAZINE -DM) 6.25-15 MG/5ML syrup; Take 5 mLs by mouth 3 (three) times daily as needed for cough.  Dispense: 180 mL; Refill: 0   Work note provided. Return precautions given. Final Clinical Impressions(s) / UC Diagnoses   Final diagnoses:  Acute tonsillitis, unspecified etiology  Acute upper respiratory infection   Discharge Instructions   None    ED Prescriptions     Medication Sig Dispense Auth. Provider   amoxicillin  (AMOXIL ) 875 MG tablet Take 1 tablet (875 mg total) by mouth 2 (two) times daily. 20 tablet Arloa Suzen RAMAN, NP   promethazine -dextromethorphan (PROMETHAZINE -DM) 6.25-15 MG/5ML syrup Take 5 mLs by mouth 3 (three) times daily as needed for cough. 180 mL  Arloa Suzen RAMAN, NP      PDMP not reviewed this encounter.   Arloa Suzen RAMAN, NP 01/07/24 2220

## 2024-01-07 NOTE — ED Triage Notes (Signed)
 Patient presents with headache, left ear pain and sore throat x day 3. Treated with Tylenol.

## 2024-12-10 ENCOUNTER — Encounter (HOSPITAL_COMMUNITY): Payer: Self-pay

## 2024-12-10 ENCOUNTER — Other Ambulatory Visit: Payer: Self-pay

## 2024-12-10 ENCOUNTER — Emergency Department (HOSPITAL_COMMUNITY)
Admission: EM | Admit: 2024-12-10 | Discharge: 2024-12-10 | Disposition: A | Discharge status: Home / Self Care | Payer: Self-pay | Attending: Emergency Medicine | Admitting: Emergency Medicine

## 2024-12-10 DIAGNOSIS — G43809 Other migraine, not intractable, without status migrainosus: Secondary | ICD-10-CM | POA: Insufficient documentation

## 2024-12-10 DIAGNOSIS — E876 Hypokalemia: Secondary | ICD-10-CM | POA: Insufficient documentation

## 2024-12-10 LAB — CBC WITH DIFFERENTIAL/PLATELET
Abs Immature Granulocytes: 0.03 K/uL (ref 0.00–0.07)
Basophils Absolute: 0 K/uL (ref 0.0–0.1)
Basophils Relative: 0 %
Eosinophils Absolute: 0.1 K/uL (ref 0.0–0.5)
Eosinophils Relative: 1 %
HCT: 42.2 % (ref 36.0–46.0)
Hemoglobin: 13.9 g/dL (ref 12.0–15.0)
Immature Granulocytes: 0 %
Lymphocytes Relative: 22 %
Lymphs Abs: 1.7 K/uL (ref 0.7–4.0)
MCH: 29.1 pg (ref 26.0–34.0)
MCHC: 32.9 g/dL (ref 30.0–36.0)
MCV: 88.3 fL (ref 80.0–100.0)
Monocytes Absolute: 0.5 K/uL (ref 0.1–1.0)
Monocytes Relative: 6 %
Neutro Abs: 5.4 K/uL (ref 1.7–7.7)
Neutrophils Relative %: 71 %
Platelets: 351 K/uL (ref 150–400)
RBC: 4.78 MIL/uL (ref 3.87–5.11)
RDW: 14 % (ref 11.5–15.5)
WBC: 7.7 K/uL (ref 4.0–10.5)
nRBC: 0 % (ref 0.0–0.2)

## 2024-12-10 LAB — RESP PANEL BY RT-PCR (RSV, FLU A&B, COVID)  RVPGX2
Influenza A by PCR: NEGATIVE
Influenza B by PCR: NEGATIVE
Resp Syncytial Virus by PCR: NEGATIVE
SARS Coronavirus 2 by RT PCR: NEGATIVE

## 2024-12-10 LAB — BASIC METABOLIC PANEL WITH GFR
Anion gap: 10 (ref 5–15)
BUN: 6 mg/dL (ref 6–20)
CO2: 25 mmol/L (ref 22–32)
Calcium: 9.3 mg/dL (ref 8.9–10.3)
Chloride: 102 mmol/L (ref 98–111)
Creatinine, Ser: 0.77 mg/dL (ref 0.44–1.00)
GFR, Estimated: >60 mL/min (ref >60)
Glucose, Bld: 99 mg/dL (ref 70–99)
Potassium: 3.4 mmol/L — ABNORMAL LOW (ref 3.5–5.1)
Sodium: 137 mmol/L (ref 135–145)

## 2024-12-10 LAB — MAGNESIUM: Magnesium: 2.1 mg/dL (ref 1.7–2.4)

## 2024-12-10 LAB — PREGNANCY, URINE: Preg Test, Ur: NEGATIVE

## 2024-12-10 MED ORDER — SODIUM CHLORIDE 0.9 % IV BOLUS
1000.0000 mL | Freq: Once | INTRAVENOUS | Status: AC
  Administered 2024-12-10: 1000 mL via INTRAVENOUS

## 2024-12-10 MED ORDER — BUTALBITAL-APAP-CAFFEINE 50-325-40 MG PO TABS: 1.0000 | ORAL_TABLET | ORAL | 0 refills | Status: AC | PRN

## 2024-12-10 MED ORDER — METOCLOPRAMIDE HCL 5 MG/ML IJ SOLN
5.0000 mg | Freq: Once | INTRAMUSCULAR | Status: AC
  Administered 2024-12-10: 5 mg via INTRAVENOUS
  Filled 2024-12-10: qty 2

## 2024-12-10 MED ORDER — MAGNESIUM SULFATE 2 GM/50ML IV SOLN
2.0000 g | Freq: Once | INTRAVENOUS | Status: AC
  Administered 2024-12-10: 2 g via INTRAVENOUS
  Filled 2024-12-10: qty 50

## 2024-12-10 MED ORDER — DEXAMETHASONE 4 MG PO TABS
8.0000 mg | ORAL_TABLET | Freq: Once | ORAL | Status: AC
  Administered 2024-12-10: 8 mg via ORAL
  Filled 2024-12-10: qty 2

## 2024-12-10 MED ORDER — POTASSIUM CHLORIDE CRYS ER 20 MEQ PO TBCR
40.0000 meq | EXTENDED_RELEASE_TABLET | Freq: Once | ORAL | Status: AC
  Administered 2024-12-10: 40 meq via ORAL
  Filled 2024-12-10: qty 2

## 2024-12-10 MED ORDER — DIPHENHYDRAMINE HCL 50 MG/ML IJ SOLN
25.0000 mg | Freq: Once | INTRAMUSCULAR | Status: AC
  Administered 2024-12-10: 25 mg via INTRAVENOUS
  Filled 2024-12-10: qty 1

## 2024-12-10 NOTE — ED Triage Notes (Signed)
 Pt bib EMS for Migraine started 12 hrs ago. front of head. Stroke scale neg 160/98 CBG 114 A&Ox4. Ibuprofen taken

## 2024-12-10 NOTE — ED Provider Notes (Signed)
 [place] EMERGENCY DEPARTMENT AT [facility] Provider Note  CSN: [id number] Arrival date & time: 12/10/24 9050  Chief Complaint(s) Migraine  HPI [name] is a 44 y.o. female with past medical history as below, significant for obesity, thc use who presents to the ED with complaint of headache  Pt reports onset of headache around midnight. Throbbing/pulsing sensation, photophobia, phonophobia, nausea. Did vomit x1 but not feeling nausea any longer. No fever, no travel, no sig neck pain, no vision/hearing changes, no gait change. No weakness  Past Medical History Past Medical History:  Diagnosis Date   Obesity    There are no active problems to display for this patient.  Home Medication(s) Prior to Admission medications  Medication Sig Start Date End Date Taking? Authorizing Provider  butalbital-acetaminophen -caffeine (FIORICET) 50-325-40 MG tablet Take 1 tablet by mouth every 4 (four) hours as needed for headache. 12/10/24 12/10/25 Yes Elnor Jayson LABOR, DO  albuterol  (VENTOLIN  HFA) 108 (90 Base) MCG/ACT inhaler Inhale 1-2 puffs into the lungs every 4 (four) hours as needed for wheezing or shortness of breath. 01/02/22   Mortenson, Ashley, MD  amoxicillin  (AMOXIL ) 875 MG tablet Take 1 tablet (875 mg total) by mouth 2 (two) times daily. 01/07/24   Arloa Suzen RAMAN, NP  famotidine  (PEPCID ) 20 MG tablet Take 1 tablet (20 mg total) by mouth 2 (two) times daily. 10/05/18   Arloa Suzen RAMAN, NP  fluticasone  (FLONASE ) 50 MCG/ACT nasal spray Place 2 sprays into both nostrils daily. 01/02/22   Mortenson, Ashley, MD  ondansetron  (ZOFRAN -ODT) 4 MG disintegrating tablet Take 1 tablet (4 mg total) by mouth every 8 (eight) hours as needed for nausea or vomiting. 06/04/23   Vonna Sharlet POUR, MD  promethazine -dextromethorphan (PROMETHAZINE -DM) 6.25-15 MG/5ML syrup Take 5 mLs by mouth 3 (three) times daily as needed for cough. 01/07/24   Arloa Suzen RAMAN, NP  Spacer/Aero-Holding Raguel  (AEROCHAMBER PLUS) inhaler Use with inhaler 01/02/22   Van Knee, MD                                                                                                                                    Past Surgical History Past Surgical History:  Procedure Laterality Date   ABDOMINAL HYSTERECTOMY     Family History History reviewed. No pertinent family history.  Social History Social History[1] Allergies Patient has no known allergies.  Review of Systems A thorough review of systems was obtained and all systems are negative except as noted in the HPI and PMH.   Physical Exam Vital Signs  I have reviewed the triage vital signs BP (!) 154/94   Pulse 93   Temp 98.4 F (36.9 C) (Oral)   Resp 18   Ht 5' 5 (1.651 m)   Wt 107.5 kg   SpO2 100%   BMI 39.44 kg/m  Physical Exam Vitals and nursing note reviewed. Exam conducted with a chaperone present (April paramedic).  Constitutional:      General: She is not in acute distress.    Appearance: Normal appearance. She is well-developed. She is not ill-appearing.  HENT:     Head: Normocephalic and atraumatic.     Right Ear: External ear normal.     Left Ear: External ear normal.     Nose: Nose normal.     Mouth/Throat:     Mouth: Mucous membranes are moist.  Eyes:     General: No scleral icterus.       Right eye: No discharge.        Left eye: No discharge.     Extraocular Movements: Extraocular movements intact.     Pupils: Pupils are equal, round, and reactive to light.  Cardiovascular:     Rate and Rhythm: Normal rate.  Pulmonary:     Effort: Pulmonary effort is normal. No respiratory distress.     Breath sounds: No stridor.  Abdominal:     General: Abdomen is flat. There is no distension.     Tenderness: There is no guarding.  Musculoskeletal:        General: No deformity.     Cervical back: No rigidity.  Skin:    General: Skin is warm and dry.     Coloration: Skin is not cyanotic, jaundiced or pale.   Neurological:     Mental Status: She is alert and oriented to person, place, and time.     GCS: GCS eye subscore is 4. GCS verbal subscore is 5. GCS motor subscore is 6.     Cranial Nerves: Cranial nerves 2-12 are intact. No dysarthria or facial asymmetry.     Sensory: Sensation is intact.     Motor: Motor function is intact. No tremor.     Coordination: Coordination is intact. Finger-Nose-Finger Test normal.     Comments: Strength 5/5 to BLUE/BLLE, equal and symmetric    Psychiatric:        Speech: Speech normal.        Behavior: Behavior normal. Behavior is cooperative.     ED Results and Treatments Labs (all labs ordered are listed, but only abnormal results are displayed) Labs Reviewed  BASIC METABOLIC PANEL WITH GFR - Abnormal; Notable for the following components:      Result Value   Potassium 3.4 (*)    All other components within normal limits  RESP PANEL BY RT-PCR (RSV, FLU A&B, COVID)  RVPGX2  CBC WITH DIFFERENTIAL/PLATELET  MAGNESIUM  PREGNANCY, URINE                                                                                                                          Radiology No results found.  Pertinent labs & imaging results that were available during my care of the patient were reviewed by me and considered in my medical decision making (see MDM for details).  Medications Ordered in ED Medications  sodium chloride  0.9 % bolus 1,000 mL (0 mLs Intravenous Stopped  12/10/24 1241)  metoCLOPramide (REGLAN) injection 5 mg (5 mg Intravenous Given 12/10/24 1129)  diphenhydrAMINE (BENADRYL) injection 25 mg (25 mg Intravenous Given 12/10/24 1116)  magnesium sulfate IVPB 2 g 50 mL (0 g Intravenous Stopped 12/10/24 1132)  dexamethasone (DECADRON) tablet 8 mg (8 mg Oral Given 12/10/24 1318)  potassium chloride SA (KLOR-CON M) CR tablet 40 mEq (40 mEq Oral Given 12/10/24 1318)                                                                                                                                      Procedures Procedures  (including critical care time)  Medical Decision Making / ED Course    Medical Decision Making:    [name] is a 44 y.o. female with past medical history as below, significant for obesity, thc use who presents to the ED with complaint of headache. The complaint involves an extensive differential diagnosis and also carries with it a high risk of complications and morbidity.  Serious etiology was considered. Ddx includes but is not limited to: Differential diagnosis includes but is not exclusive to subarachnoid hemorrhage, meningitis, encephalitis, previous head trauma, cavernous venous thrombosis, muscle tension headache, glaucoma, temporal arteritis, migraine or migraine equivalent, etc.   Complete initial physical exam performed, notably the patient was in NAD, neuro intact.    Reviewed and confirmed nursing documentation for past medical history, family history, social history.  Vital signs reviewed.    Headache > - Headache is characteristic of migraine type headache.  She is neuro intact.  No trauma.  No fever, no meningismus - Screening labs > labs stable, k mild low- replaced - Migraine cocktail > symptoms improved - Will give Decadron to prevent rebound headache, reviewed PDMP, will prescribe Fioricet for home.  Headache precautions, strict return precautions    Clinical Course as of 12/10/24 1327  Thu Dec 10, 2024  1158 Feeling better [SG]    Clinical Course User Index [SG] Elnor Jayson LABOR, DO     Patient presents with headache. Based on the patient's history and physical there is very low clinical suspicion for significant intracranial pathology. The headache was not sudden onset, not maximal at onset, there are no neurologic findings on exam, the patient does not have a fever, the patient does not have any jaw claudication, the patient does not endorse a clotting disorder, patient denies any trauma or  eye pain and the headache is not associated with dizziness, weakness on one side of the body, diplopia, vertigo, slurred speech, or ataxia. Given the extremely low risk of these diagnoses further testing and evaluation for these possibilities does not appear to be indicated at this time.   The patient improved significantly and was discharged in stable condition. Detailed discussions were had with the patient regarding current findings, and need for close f/u with PCP or on call doctor. The patient has been instructed to return immediately  if the symptoms worsen in any way for re-evaluation. Patient verbalized understanding and is in agreement with current care plan. All questions answered prior to discharge.               Additional history obtained: -Additional history obtained from family -External records from outside source obtained and reviewed including: Chart review including previous notes, labs, imaging, consultation notes including  Prior urgent care documentation   Lab Tests: -I ordered, reviewed, and interpreted labs.   The pertinent results include:   Labs Reviewed  BASIC METABOLIC PANEL WITH GFR - Abnormal; Notable for the following components:      Result Value   Potassium 3.4 (*)    All other components within normal limits  RESP PANEL BY RT-PCR (RSV, FLU A&B, COVID)  RVPGX2  CBC WITH DIFFERENTIAL/PLATELET  MAGNESIUM  PREGNANCY, URINE    Notable for mild hypokalemia  EKG   EKG Interpretation Date/Time:    Ventricular Rate:    PR Interval:    QRS Duration:    QT Interval:    QTC Calculation:   R Axis:      Text Interpretation:           Imaging Studies ordered: na   Medicines ordered and prescription drug management: Meds ordered this encounter  Medications   sodium chloride  0.9 % bolus 1,000 mL   metoCLOPramide (REGLAN) injection 5 mg   diphenhydrAMINE (BENADRYL) injection 25 mg   magnesium sulfate IVPB 2 g 50 mL   dexamethasone  (DECADRON) tablet 8 mg   potassium chloride SA (KLOR-CON M) CR tablet 40 mEq   butalbital-acetaminophen -caffeine (FIORICET) 50-325-40 MG tablet    Sig: Take 1 tablet by mouth every 4 (four) hours as needed for headache.    Dispense:  20 tablet    Refill:  0    -I have reviewed the patients home medicines and have made adjustments as needed   Consultations Obtained: na   Cardiac Monitoring: Continuous pulse oximetry interpreted by myself, 100% on RA.    Social Determinants of Health:  Diagnosis or treatment significantly limited by social determinants of health: obesity, THC use   Reevaluation: After the interventions noted above, I reevaluated the patient and found that they have improved  Co morbidities that complicate the patient evaluation  Past Medical History:  Diagnosis Date   Obesity       Dispostion: Disposition decision including need for hospitalization was considered, and patient discharged from emergency department.    Final Clinical Impression(s) / ED Diagnoses Final diagnoses:  Other migraine without status migrainosus, not intractable  Hypokalemia          [1]  Social History Tobacco Use   Smoking status: Every Day    Current packs/day: 0.50    Types: Cigarettes   Smokeless tobacco: Never  Substance Use Topics   Alcohol use: No   Drug use: Not Currently    Types: Marijuana    Comment: occasionally     Elnor Jayson LABOR, DO 12/10/24 1327

## 2024-12-10 NOTE — Discharge Instructions (Addendum)
 It was a pleasure caring for you today in the emergency department.  Please get plenty of rest over the next few days, drink plenty of liquids.  Follow-up with your PCP for recheck   please return to the emergency department for any worsening or worrisome symptoms.
